# Patient Record
Sex: Male | Born: 1970 | Race: White | Hispanic: No | Marital: Married | Smoking: Former smoker
Health system: Southern US, Community
[De-identification: ages and names within clinical notes are randomized; demographics above are authoritative.]

## PROBLEM LIST (undated history)

## (undated) DIAGNOSIS — T7840XA Allergy, unspecified, initial encounter: Secondary | ICD-10-CM

## (undated) DIAGNOSIS — I10 Essential (primary) hypertension: Secondary | ICD-10-CM

## (undated) DIAGNOSIS — E785 Hyperlipidemia, unspecified: Secondary | ICD-10-CM

## (undated) HISTORY — DX: Hyperlipidemia, unspecified: E78.5

## (undated) HISTORY — PX: URETHRAL DILATION: SUR417

## (undated) HISTORY — DX: Essential (primary) hypertension: I10

## (undated) HISTORY — DX: Allergy, unspecified, initial encounter: T78.40XA

---

## 1999-01-31 HISTORY — PX: COLONOSCOPY: SHX174

## 2016-02-25 ENCOUNTER — Ambulatory Visit: Payer: 59 | Admitting: Family Medicine

## 2016-03-06 ENCOUNTER — Encounter: Payer: Self-pay | Admitting: Family Medicine

## 2016-03-06 ENCOUNTER — Ambulatory Visit (INDEPENDENT_AMBULATORY_CARE_PROVIDER_SITE_OTHER): Payer: 59 | Admitting: Family Medicine

## 2016-03-06 VITALS — BP 130/68 | HR 84 | Temp 98.4°F | Resp 14 | Ht 75.0 in | Wt 231.0 lb

## 2016-03-06 DIAGNOSIS — I10 Essential (primary) hypertension: Secondary | ICD-10-CM

## 2016-03-06 DIAGNOSIS — S46002A Unspecified injury of muscle(s) and tendon(s) of the rotator cuff of left shoulder, initial encounter: Secondary | ICD-10-CM

## 2016-03-06 DIAGNOSIS — E785 Hyperlipidemia, unspecified: Secondary | ICD-10-CM | POA: Diagnosis not present

## 2016-03-06 DIAGNOSIS — Z0000 Encounter for general adult medical examination without abnormal findings: Secondary | ICD-10-CM

## 2016-03-06 DIAGNOSIS — M25362 Other instability, left knee: Secondary | ICD-10-CM | POA: Diagnosis not present

## 2016-03-06 NOTE — Assessment & Plan Note (Signed)
He is on very minimal medication, I honestly do not think it is doing anything for him TIW, so will discontinue and have him monitor his BP

## 2016-03-06 NOTE — Assessment & Plan Note (Signed)
Continue red yeast rice, recheck fasting labs

## 2016-03-06 NOTE — Patient Instructions (Signed)
Return for fasting labs  MRI of shoulder/Knee  Stop Lisinopril You can stop the Biotin F/U pending results

## 2016-03-06 NOTE — Progress Notes (Signed)
Patient ID: Christopher Keller Cino, male   DOB: September 27, 1971, 45 y.o.   MRN: [id number]   Subjective:    Patient ID: Christopher Keller Inskeep, male    DOB: September 27, 1971, 45 y.o.   MRN: [id number]  [facility]  Patient here to establish care. His previous primary care provider was in [place]-Dr. Caceras   His past medical history as well as medications were reviewed. He has history of hypertension noted the past couple years when he gained weight. He is down 20 pounds from his heaviest. He now takes lisinopril 2.5 mg 3 times a week yesterday started tapering off himself. He does not desire to be on any medications for blood pressure. He does have family history of some hypertension and diabetes.  He has history of migraines he tried prescription medications but was concerned about all the side effects therefore he only uses Advil he gets them typically 2-3 times a year.  Left knee discomfort worsened over the past couple months. He denies the particular pain but states that he gets significant popping and feels like he has not stable PSA started running again back in March and try to lose weight and that is when he has noted increased issues with his knee.  Left shoulder pain for the past 6 months. He denies any particular injury but he's actually had to avoid lifting or doing overhead motions because of pain and limited range of motion he states that his shoulder was causing issues before he started exercising again. He is concerned that he may have torn something but [place] evaluated.  He also notices when he runs he gets discomfort in his rectal area directly afterwards. He does have history of hemorrhoids has had an early colonoscopy but is on keeps his bowel soft is not had any hemorrhoidal bleeding. He has not noticed any change in his hemorrhoids with his running regimen. At times he may have soreness right in the gluteal cleft and rectal area  for couple days after he runs. If he does not run he does not have any pain he also does not have any pain with bowel movements.    Review Of Systems:  GEN- denies fatigue, fever, weight loss,weakness, recent illness HEENT- denies eye drainage, change in vision, nasal discharge, CVS- denies chest pain, palpitations RESP- denies SOB, cough, wheeze ABD- denies N/V, change in stools, abd pain GU- denies dysuria, hematuria, dribbling, incontinence MSK- +joint pain, muscle aches, injury Neuro- denies headache, dizziness, syncope, seizure activity       Objective:    BP 130/68 mmHg  Pulse 84  Temp(Src) 98.4 F (36.9 C) (Oral)  Resp 14  Ht 6\' 3"  (1.905 m)  Wt 231 lb (104.781 kg)  BMI 28.87 kg/m2 GEN- NAD, alert and oriented x3 HEENT- PERRL, EOMI, non injected sclera, pink conjunctiva, MMM, oropharynx clear Neck- Supple, no thyromegaly CVS- RRR, no murmur RESP-CTAB ABD-NABS,soft,NT,ND MSK- Spine NT, neg SLR, mild TTP in gluteal cleft near tip of coccyx, FROM Right UE, Decreased ROM LUE, pain with neer's, pain with empty can left side, back scratch limited ROM, biceps in tact, Bilat knee normal inspection, popping sensation left knee with flexion of foot, no effusion,ligaments in tact, patella tracks okay   EXT- No edema Pulses- Radial, DP- 2+        Assessment & Plan:    Discussed his suppplements, he can D/C Biotin, ginko, not necessary at this time  Problem List Items Addressed This Visit    Hyperlipidemia    Continue red yeast rice, recheck fasting labs      Essential hypertension    He is on very minimal medication, I honestly do not think it is doing anything for him TIW, so will discontinue and have him monitor his BP       Other Visit Diagnoses    Routine general medical examination at a health care facility    -  Primary    CPE done, return for fasting labs, immunizations UTD, unclear cuase of the pain in gluteal cleft, no injury, possible  MSK instability  with running ?    Rotator cuff injury, left, initial encounter        Concern for rotator cuff/ or possible deltoid injury, obtain MRI, he has loss of ROM for > 3 months now    Knee instability, left        Obtain xray of left knee evaluate for loose body, degenerative changes       Note: This dictation was prepared with Dragon dictation along with smaller phrase technology. Any transcriptional errors that result from this process are unintentional.

## 2016-03-17 ENCOUNTER — Other Ambulatory Visit: Payer: 59

## 2016-03-17 DIAGNOSIS — Z0000 Encounter for general adult medical examination without abnormal findings: Secondary | ICD-10-CM

## 2016-03-17 LAB — COMPREHENSIVE METABOLIC PANEL
ALT: 13 U/L (ref 9–46)
AST: 16 U/L (ref 10–40)
Albumin: 4.3 g/dL (ref 3.6–5.1)
Alkaline Phosphatase: 56 U/L (ref 40–115)
BUN: 15 mg/dL (ref 7–25)
CALCIUM: 9.6 mg/dL (ref 8.6–10.3)
CO2: 26 mmol/L (ref 20–31)
CREATININE: 1.02 mg/dL (ref 0.60–1.35)
Chloride: 102 mmol/L (ref 98–110)
GLUCOSE: 105 mg/dL — AB (ref 70–99)
Potassium: 4.7 mmol/L (ref 3.5–5.3)
SODIUM: 137 mmol/L (ref 135–146)
Total Bilirubin: 0.5 mg/dL (ref 0.2–1.2)
Total Protein: 6.8 g/dL (ref 6.1–8.1)

## 2016-03-17 LAB — CBC WITH DIFFERENTIAL/PLATELET
BASOS ABS: 56 {cells}/uL (ref 0–200)
Basophils Relative: 1 %
Eosinophils Absolute: 560 cells/uL — ABNORMAL HIGH (ref 15–500)
Eosinophils Relative: 10 %
HEMATOCRIT: 41.3 % (ref 38.5–50.0)
HEMOGLOBIN: 13.7 g/dL (ref 13.0–17.0)
LYMPHS ABS: 1792 {cells}/uL (ref 850–3900)
LYMPHS PCT: 32 %
MCH: 29.5 pg (ref 27.0–33.0)
MCHC: 33.2 g/dL (ref 32.0–36.0)
MCV: 89 fL (ref 80.0–100.0)
MONO ABS: 392 {cells}/uL (ref 200–950)
MPV: 9.2 fL (ref 7.5–12.5)
Monocytes Relative: 7 %
NEUTROS PCT: 50 %
Neutro Abs: 2800 cells/uL (ref 1500–7800)
Platelets: 291 10*3/uL (ref 140–400)
RBC: 4.64 MIL/uL (ref 4.20–5.80)
RDW: 13.8 % (ref 11.0–15.0)
WBC: 5.6 10*3/uL (ref 3.8–10.8)

## 2016-03-17 LAB — LIPID PANEL
Cholesterol: 195 mg/dL (ref 125–200)
HDL: 51 mg/dL (ref >=40)
LDL Cholesterol: 116 mg/dL (ref <130)
Total CHOL/HDL Ratio: 3.8 Ratio (ref <=5.0)
Triglycerides: 138 mg/dL (ref <150)
VLDL: 28 mg/dL (ref <30)

## 2016-03-18 LAB — PSA: PSA: 0.64 ng/mL (ref <=4.00)

## 2016-04-08 ENCOUNTER — Telehealth: Payer: Self-pay | Admitting: Family Medicine

## 2016-04-08 NOTE — Telephone Encounter (Signed)
-----   Message from Donne AnonKim M Plummer, LPN sent at 4/[id number]/09816/14/2017  3:00 PM EDT ----- Regarding: RE: MRI Did call pt and he has been busy out of state with his job.  Says yes he has been procrastinating about calling them back. ----- Message -----    From: Salley ScarletKawanta F Hebron, MD    Sent: 04/08/2016   2:04 PM      To: Donne AnonKim M Plummer, LPN Subject: MRI                                              When is pt MRI scheduled, this  Is one I did peer to peer a about 2 weeks ago, I dont see appt time for the MRI

## 2016-04-08 NOTE — Telephone Encounter (Signed)
noted 

## 2016-04-24 ENCOUNTER — Ambulatory Visit
Admission: RE | Admit: 2016-04-24 | Discharge: 2016-04-24 | Disposition: A | Discharge status: Home / Self Care | Payer: 59 | Source: Ambulatory Visit | Attending: Family Medicine | Admitting: Family Medicine

## 2016-04-24 ENCOUNTER — Other Ambulatory Visit: Payer: Self-pay | Admitting: *Deleted

## 2016-04-24 DIAGNOSIS — M25362 Other instability, left knee: Secondary | ICD-10-CM

## 2016-04-24 DIAGNOSIS — M75100 Unspecified rotator cuff tear or rupture of unspecified shoulder, not specified as traumatic: Secondary | ICD-10-CM

## 2016-04-24 DIAGNOSIS — S46002A Unspecified injury of muscle(s) and tendon(s) of the rotator cuff of left shoulder, initial encounter: Secondary | ICD-10-CM

## 2016-04-24 MED ORDER — MELOXICAM 7.5 MG PO TABS: 7.5000 mg | ORAL_TABLET | Freq: Every day | ORAL | Status: DC

## 2016-08-13 ENCOUNTER — Ambulatory Visit: Payer: 59 | Admitting: *Deleted

## 2016-08-13 DIAGNOSIS — Z0000 Encounter for general adult medical examination without abnormal findings: Secondary | ICD-10-CM

## 2016-08-13 NOTE — Progress Notes (Signed)
Patient ID: Christopher FramesBryan Keller, male   DOB: November 02, 1970, 45 y.o.   MRN: [id number]   [facility].   Patient has been seen for CPE on 03/17/2016.  [facility]

## 2016-10-29 ENCOUNTER — Emergency Department (HOSPITAL_COMMUNITY): Payer: 59

## 2016-10-29 ENCOUNTER — Encounter (HOSPITAL_COMMUNITY): Payer: Self-pay

## 2016-10-29 ENCOUNTER — Emergency Department (HOSPITAL_COMMUNITY)
Admission: EM | Admit: 2016-10-29 | Discharge: 2016-10-29 | Disposition: A | Discharge status: Home / Self Care | Payer: 59 | Attending: Emergency Medicine | Admitting: Emergency Medicine

## 2016-10-29 DIAGNOSIS — Z87891 Personal history of nicotine dependence: Secondary | ICD-10-CM | POA: Insufficient documentation

## 2016-10-29 DIAGNOSIS — Z79899 Other long term (current) drug therapy: Secondary | ICD-10-CM | POA: Insufficient documentation

## 2016-10-29 DIAGNOSIS — I10 Essential (primary) hypertension: Secondary | ICD-10-CM | POA: Insufficient documentation

## 2016-10-29 DIAGNOSIS — R55 Syncope and collapse: Secondary | ICD-10-CM | POA: Insufficient documentation

## 2016-10-29 DIAGNOSIS — R42 Dizziness and giddiness: Secondary | ICD-10-CM | POA: Diagnosis present

## 2016-10-29 DIAGNOSIS — R030 Elevated blood-pressure reading, without diagnosis of hypertension: Secondary | ICD-10-CM

## 2016-10-29 LAB — CBC WITH DIFFERENTIAL/PLATELET
BASOS PCT: 1 %
Basophils Absolute: 0 10*3/uL (ref 0.0–0.1)
EOS ABS: 0.3 10*3/uL (ref 0.0–0.7)
Eosinophils Relative: 4 %
HCT: 43.4 % (ref 39.0–52.0)
HEMOGLOBIN: 14.4 g/dL (ref 13.0–17.0)
LYMPHS ABS: 2.1 10*3/uL (ref 0.7–4.0)
Lymphocytes Relative: 33 %
MCH: 30.3 pg (ref 26.0–34.0)
MCHC: 33.2 g/dL (ref 30.0–36.0)
MCV: 91.2 fL (ref 78.0–100.0)
Monocytes Absolute: 0.4 10*3/uL (ref 0.1–1.0)
Monocytes Relative: 6 %
NEUTROS ABS: 3.5 10*3/uL (ref 1.7–7.7)
NEUTROS PCT: 56 %
Platelets: 242 10*3/uL (ref 150–400)
RBC: 4.76 MIL/uL (ref 4.22–5.81)
RDW: 13 % (ref 11.5–15.5)
WBC: 6.3 10*3/uL (ref 4.0–10.5)

## 2016-10-29 LAB — BASIC METABOLIC PANEL
Anion gap: 9 (ref 5–15)
BUN: 14 mg/dL (ref 6–20)
CHLORIDE: 102 mmol/L (ref 101–111)
CO2: 27 mmol/L (ref 22–32)
Calcium: 9.4 mg/dL (ref 8.9–10.3)
Creatinine, Ser: 1.13 mg/dL (ref 0.61–1.24)
GFR calc non Af Amer: >60 mL/min (ref >60)
Glucose, Bld: 103 mg/dL — ABNORMAL HIGH (ref 65–99)
POTASSIUM: 4.1 mmol/L (ref 3.5–5.1)
SODIUM: 138 mmol/L (ref 135–145)

## 2016-10-29 MED ORDER — LISINOPRIL 10 MG PO TABS
10.0000 mg | ORAL_TABLET | Freq: Once | ORAL | Status: AC
  Administered 2016-10-29: 10 mg via ORAL
  Filled 2016-10-29: qty 1

## 2016-10-29 MED ORDER — SODIUM CHLORIDE 0.9 % IV BOLUS (SEPSIS)
1000.0000 mL | Freq: Once | INTRAVENOUS | Status: AC
  Administered 2016-10-29: 1000 mL via INTRAVENOUS

## 2016-10-29 MED ORDER — LISINOPRIL 10 MG PO TABS: 10.0000 mg | ORAL_TABLET | Freq: Every day | ORAL | 0 refills | Status: DC

## 2016-10-29 NOTE — ED Notes (Addendum)
Pt has had a syncopal episode on new years eve-- unusual for patient.  Was in a meeting today, standing up, felt same way-- left meeting and went to nurses office and felt like he could not talk- has hx of high blood pressure- untreated.  Episode today started at 11:30am

## 2016-10-29 NOTE — ED Notes (Signed)
Pt resting comfortably, family at bedside.  Pt ambulated to the Br without difficulty, denies any dizziness.

## 2016-10-29 NOTE — Discharge Instructions (Signed)
It is very important he follow-up with your primary care provider next week for a blood pressure recheck and discussion of today's diagnosis. Please return to the ER for returning dizziness, slurred speech, new or worsening symptoms, any additional concerns.

## 2016-10-29 NOTE — ED Triage Notes (Signed)
CBG 123 pt had an episode where he felt dizzy and than had a headache with some light headedness and had some difficulty speaking pt currently has no deficits

## 2016-10-29 NOTE — ED Notes (Signed)
Pt returned to room from imaging department.  

## 2016-10-29 NOTE — ED Provider Notes (Signed)
[facility]EMER[facility]ENCY DEPT Provider Note   CSN: [id number] Arrival date & time: 10/29/16  1238     History   Chief Complaint Chief Complaint  Patient presents with  . Dizziness    pt was at work when he suddenly felt dizzy and had some difficulty speaking at 11:30 am this morning symptoms are now resolved     HPI Christopher Keller is a 46 y.o. male.  The history is provided by the patient and medical records. No language interpreter was used.     Christopher Keller is a 46 y.o. male  who presents to the Emergency Department for evaluation following near syncopal episode at 11:30 this morning. Patient states that he was at work, standing up during a meeting in a cool room when he suddenly felt flushed. He then began feeling dizzy which was described as his vision tunneling and feeling as if he was going to pass out. He went out of the meeting and to the office nurse. He then sat down on, never having a complete syncopal episode or loss of consciousness. He did not fall or hit his head. He states during the episode he had difficulty speaking (trying to get words out) but no slurred speech. Sensation states that he has had 5-6 similar episodes, however all of these have occurred right before urination. He has been seen and worked up by a primary care provider for this in the past and told that sometimes this may happen but no true etiology known. Denies muscle weakness. No recent illness or fevers. He felt somewhat improved after sitting in upon arrival to ED patient states symptoms are nearly resolved. He states that his head still feels "groggy" and a little lightheaded, but otherwise back to his baseline. He did receive 2 baby aspirin at work prior to his arrival today, but no other medications taken. Patient does have a history of "borderline" hypertension which is has been trying to improve with diet and exercise. He has never been placed on medication.    Past Medical History:  Diagnosis Date  . Allergy    seasonal  . Hyperlipidemia   . Hypertension     Patient Active Problem List   Diagnosis Date Noted  . Hyperlipidemia 03/06/2016  . Essential hypertension 03/06/2016    Past Surgical History:  Procedure Laterality Date  . COLONOSCOPY  01/31/1999  . URETHRAL DILATION         Home Medications    Prior to Admission medications   Medication Sig Start Date End Date Taking? Authorizing Provider  Cholecalciferol (VITAMIN D) 2000 units CAPS Take 1 capsule by mouth daily.    Yes Historical Provider, MD  CINNAMON PO Take 1 tablet by mouth daily.   Yes Historical Provider, MD  Coenzyme Q10 (CO Q 10) 10 MG CAPS Take 1 tablet by mouth daily.    Yes Historical Provider, MD  dextromethorphan-guaiFENesin (MUCINEX DM) 30-600 MG 12hr tablet Take 1 tablet by mouth 2 (two) times daily as needed for cough.   Yes Historical Provider, MD  Ginkgo Biloba 40 MG TABS Take 1 tablet by mouth daily.    Yes Historical Provider, MD  Glucosamine-Chondroit-Vit C-Mn (GLUCOSAMINE 1500 COMPLEX PO) Take 1 tablet by mouth daily.    Yes Historical Provider, MD  OVER THE COUNTER MEDICATION Take 1 tablet by mouth daily. Prostate Plus    Yes Historical Provider, MD  Red Yeast Rice Extract (RED YEAST RICE PO) Take 1 tablet by mouth daily.    Yes Historical Provider,  MD  vitamin C (ASCORBIC ACID) 500 MG tablet Take 1,000 mg by mouth daily.   Yes Historical Provider, MD  lisinopril (PRINIVIL,ZESTRIL) 10 MG tablet Take 1 tablet (10 mg total) by mouth daily. 10/29/16   Chase Picket Shandria Clinch, PA-C  meloxicam (MOBIC) 7.5 MG tablet Take 1 tablet (7.5 mg total) by mouth daily. Patient not taking: Reported on 10/29/2016 04/24/16   Salley Scarlet, MD    Family History Family History  Problem Relation Age of Onset  . Alcohol abuse Father   . Hypertension Father   . Hyperlipidemia Father   . Hypertension Mother   . Hyperlipidemia Mother   . Diabetes Maternal Grandmother     Social History Social History  Substance Use Topics    . Smoking status: Former Games developer  . Smokeless tobacco: Former Neurosurgeon  . Alcohol use 0.0 oz/week     Comment: occasionally      Allergies   Patient has no known allergies.   Review of Systems Review of Systems  Constitutional: Negative for chills and fever.  HENT: Negative for trouble swallowing.   Eyes: Negative for photophobia.  Respiratory: Negative for cough and shortness of breath.   Cardiovascular: Negative.   Gastrointestinal: Negative for abdominal pain, nausea and vomiting.  Genitourinary: Negative for dysuria.  Musculoskeletal: Negative for back pain and neck pain.  Skin: Negative for rash.  Neurological: Positive for dizziness, weakness and headaches.     Physical Exam Updated Vital Signs BP (!) 155/102   Pulse 79   Temp 98 F (36.7 C)   Resp 14   Ht 6\' 3"  (1.905 m)   Wt 106.1 kg   SpO2 100%   BMI 29.25 kg/m   Physical Exam  Constitutional: He is oriented to person, place, and time. He appears well-developed and well-nourished. No distress.  HENT:  Head: Normocephalic and atraumatic.  Eyes: EOM are normal. Pupils are equal, round, and reactive to light.  No nystagmus.  Cardiovascular: Normal rate, regular rhythm and normal heart sounds.   No murmur heard. Pulmonary/Chest: Effort normal and breath sounds normal. No respiratory distress. He has no wheezes. He has no rales.  Abdominal: Soft. He exhibits no distension. There is no tenderness.  Musculoskeletal: Normal range of motion.  Neurological: He is alert and oriented to person, place, and time.  Alert, oriented, thought content appropriate, able to give a coherent history. Speech is clear and goal oriented, able to follow commands.  Cranial Nerves:  II:  Peripheral visual fields grossly normal, pupils equal, round, reactive to light III, IV, VI: EOM intact bilaterally, ptosis not present V,VII: smile symmetric, eyes kept closed tightly against resistance, facial light touch sensation equal VIII:  hearing grossly normal IX, X: symmetric soft palate movement, uvula elevates symmetrically  XI: bilateral shoulder shrug symmetric and strong XII: midline tongue extension 5/5 muscle strength in upper and lower extremities bilaterally including strong and equal grip strength and dorsiflexion/plantar flexion Sensory to light touch normal in all four extremities.  Normal finger-to-nose and rapid alternating movements. Normal gait and balance. Negative romberg, no pronator drift.  Skin: Skin is warm and dry.  Nursing note and vitals reviewed.    ED Treatments / Results  Labs (all labs ordered are listed, but only abnormal results are displayed) Labs Reviewed  BASIC METABOLIC PANEL - Abnormal; Notable for the following:       Result Value   Glucose, Bld 103 (*)    All other components within normal limits  CBC WITH DIFFERENTIAL/PLATELET  EKG  EKG Interpretation None       Radiology Ct Head Wo Contrast  Result Date: 10/29/2016 CLINICAL DATA:  Syncopal episode.  Dizziness. EXAM: CT HEAD WITHOUT CONTRAST TECHNIQUE: Contiguous axial images were obtained from the base of the skull through the vertex without intravenous contrast. COMPARISON:  None. FINDINGS: Brain: The ventricles are normal in size and configuration. No extra-axial fluid collections are identified. The gray-white differentiation is normal. No CT findings for acute intracranial process such as hemorrhage or infarction. No mass lesions. The brainstem and cerebellum are grossly normal. Vascular: No hyperdense vessel or unexpected calcification. Skull: No skull lesions or fracture. Sinuses/Orbits: Moderate mucoperiosteal thickening involving both maxillary sinuses. The left maxillary sinus is hypoplastic. There is also scattered ethmoid sinus disease. The frontal sinuses are clear and the mastoid air cells and middle ear cavities are clear. The globes are intact. Other: No scalp lesions or hematoma. IMPRESSION: No acute  intracranial findings or mass lesions. Scattered paranasal sinus disease. Electronically Signed   By: Rudie MeyerP.  Gallerani M.D.   On: 10/29/2016 14:46    Procedures Procedures (including critical care time)  Medications Ordered in ED Medications  sodium chloride 0.9 % bolus 1,000 mL (0 mLs Intravenous Stopped 10/29/16 1627)  lisinopril (PRINIVIL,ZESTRIL) tablet 10 mg (10 mg Oral Given 10/29/16 1451)     Initial Impression / Assessment and Plan / ED Course  I have reviewed the triage vital signs and the nursing notes.  Pertinent labs & imaging results that were available during my care of the patient were reviewed by me and considered in my medical decision making (see chart for details).  Clinical Course    Christopher FramesBryan Keller is a 46 y.o. male who presents to ED for evaluation following episode of dizziness and near syncopal episode just prior to arrival. Patient had prodromal symptoms of flushing and tunnel vision as well as feeling lightheaded. He then found the nurse and sat down. No LOC or head injury. Coworker at bedside who witnessed event and states no slurred speech or muscle weakness. No focal neuro deficits on exam. Speech is clear with no evidence of dysarthria/aphasia. 5/5 muscle strength in all extremities. Initial blood pressure upon arrival of 163/1:15. Patient initially stated that he had not been on blood pressure medication in the past. Upon arrival to the wife, it was noted that he has been on lisinopril in the past and was weaned off. Will restart lisinopril today. Patient has ambulated around the ED with a steady gait and without difficulty. He has no complaints on reevaluation and all symptoms have resolved. Patient has a primary care provider and states that he can follow up closely in the next week. I stressed the importance of this follow-up. Reasons to return to the ER were discussed. All questions answered.  Patient discussed with Dr. Clayborne DanaMesner who agrees with treatment plan.   Final  Clinical Impressions(s) / ED Diagnoses   Final diagnoses:  Near syncope  Elevated blood pressure reading    New Prescriptions New Prescriptions   LISINOPRIL (PRINIVIL,ZESTRIL) 10 MG TABLET    Take 1 tablet (10 mg total) by mouth daily.     [facility] LLCJaime Pilcher Kayli Beal, PA-C 10/29/16 1637    Marily MemosJason Mesner, MD 10/30/16 [phone]

## 2016-10-29 NOTE — ED Notes (Signed)
Did receive ASA x2 at work prior to coming.

## 2016-11-04 ENCOUNTER — Encounter: Payer: Self-pay | Admitting: Family Medicine

## 2016-11-04 ENCOUNTER — Ambulatory Visit (INDEPENDENT_AMBULATORY_CARE_PROVIDER_SITE_OTHER): Payer: 59 | Admitting: Family Medicine

## 2016-11-04 VITALS — BP 148/82 | HR 82 | Temp 98.4°F | Resp 14 | Ht 75.0 in | Wt 234.0 lb

## 2016-11-04 DIAGNOSIS — G4700 Insomnia, unspecified: Secondary | ICD-10-CM | POA: Diagnosis not present

## 2016-11-04 DIAGNOSIS — I10 Essential (primary) hypertension: Secondary | ICD-10-CM | POA: Diagnosis not present

## 2016-11-04 DIAGNOSIS — R55 Syncope and collapse: Secondary | ICD-10-CM | POA: Diagnosis not present

## 2016-11-04 MED ORDER — TRAZODONE HCL 50 MG PO TABS: 25.0000 mg | ORAL_TABLET | Freq: Every evening | ORAL | 1 refills | Status: DC | PRN

## 2016-11-04 MED ORDER — LISINOPRIL 20 MG PO TABS: 20.0000 mg | ORAL_TABLET | Freq: Every day | ORAL | 3 refills | Status: DC

## 2016-11-04 NOTE — Patient Instructions (Signed)
Lisinopril 20mg   trazadone as needed for sleep  Cardiology referral  F/U after seen by cardiology

## 2016-11-04 NOTE — Progress Notes (Signed)
Subjective:    Patient ID: Christopher FramesBryan Creason, male    DOB: 1971-04-12, 46 y.o.   MRN: [id number]  Patient presents for ER F/U (HTN) Here to follow-up hypertension he had a near syncopal event he was seen in the emergency room on December 4. His blood pressure was noted to be elevated at that time. He started tapering himself off of his blood pressure medication which she told me when he established care back in May that time he was on a 2.5 mg of lisinopril 3 times a week .He had not been checking his blood pressure when he finally came off of it. He states these have random episodes actually the past years where he would just pass out this last time and [place] he was going to the restroom and found himself on the floor he states he was told more of a vasovagal syncope. But then when he was at work last week he started feeling dizzy his balance was off he started stumbling around his words were slurring he was getting flushed. They check his blood pressure was significantly high he believes 180/100 or so they checked his blood sugar this was normal at 110 he did not have any true chest pain. EMS was called he was sent to the emergency room CT scan of head was done which was unremarkable his labs were unremarkable his blood pressure was still elevated in the 150s he was given lisinopril 10 mg. And sent home. His speech and function was back to normal at discharge from the emergency room. Since then he has been monitoring his blood pressure it was still upper 140s over 80s to 90s therefore he started taking 20 mg of lisinopril. The past 2 days his morning blood pressures have been 120s over 80s however when he checks it at work which she knows a highly stressful environment for him if shoots up to the 140s. He has not had any further headache dizzy spells chest pain shortness of breath episodes these past few days. He is concerned it might be something else underlying C does have family  history of heart disease.  He is also under significant stress at work and states that it everything is always on a 10. He is not sleeping very well a few times a week he will end up taking Benadryl to help him sleep if not he will get up and work on his computer. He noted that he has poor sleep hygiene. He does still try to exercise on a regular basis is never had any difficulty while running    Review Of Systems:  GEN- denies fatigue, fever, weight loss,weakness, recent illness HEENT- denies eye drainage, change in vision, nasal discharge, CVS- denies chest pain, palpitations RESP- denies SOB, cough, wheeze ABD- denies N/V, change in stools, abd pain GU- denies dysuria, hematuria, dribbling, incontinence MSK- denies joint pain, muscle aches, injury Neuro- denies headache, +dizziness, syncope, seizure activity       Objective:    BP (!) 148/82 (BP Location: Right Arm, Patient Position: Sitting, Cuff Size: Large)   Pulse 82   Temp 98.4 F (36.9 C) (Oral)   Resp 14   Ht 6\' 3"  (1.905 m)   Wt 234 lb (106.1 kg)   SpO2 97%   BMI 29.25 kg/m  GEN- NAD, alert and oriented x3 HEENT- PERRL, EOMI, non injected sclera, pink conjunctiva, MMM, oropharynx clear Neck- Supple, no thyromegaly CVS- RRR, no murmur RESP-CTAB Neurp-CNII-XII in tact  no deficits  EXT- No edema Pulses- Radial, DP- 2+  EKG- NSR      Assessment & Plan:      Problem List Items Addressed This Visit    Essential hypertension - Primary    uncontrolled, but strong family history of heart disease and hypertension. CMET normal, CBC normal, normal TSH in past. Will have him take lisinopril 20 mg daily for now. I gently think that the highly stressful environment he is in his contributing to this elevated blood pressure but some of the symptoms he is having. It sounds like he may have had a vasovagal syncopal event on New Year's Eve when he was urinating but he is also having other episodes of passing out which could be  cardiac. I think he would benefit from cardiology evaluation and stress testing.  Discussed his stress and his sleep. The poor sleep is also contributed. Discussed to decrease his caffeine as he often drinks coffee, keep him going through the day. We discussed melatonin and he was to try more natural things he was to try to avoid prescription medications even higher doses for his blood pressure necessary. I did give him prescription for trazodone that he can use half a tablet as needed for sleep instead of taking all of the over-the-counter sleep aids.      Relevant Medications   lisinopril (PRINIVIL,ZESTRIL) 20 MG tablet   Other Relevant Orders   EKG 12-Lead (Completed)    Other Visit Diagnoses    Vasovagal syncope       Relevant Medications   lisinopril (PRINIVIL,ZESTRIL) 20 MG tablet   Insomnia, unspecified type          Note: This dictation was prepared with Dragon dictation along with smaller phrase technology. Any transcriptional errors that result from this process are unintentional.

## 2016-11-05 NOTE — Assessment & Plan Note (Signed)
uncontrolled, but strong family history of heart disease and hypertension. CMET normal, CBC normal, normal TSH in past. Will have him take lisinopril 20 mg daily for now. I gently think that the highly stressful environment he is in his contributing to this elevated blood pressure but some of the symptoms he is having. It sounds like he may have had a vasovagal syncopal event on New Year's Eve when he was urinating but he is also having other episodes of passing out which could be cardiac. I think he would benefit from cardiology evaluation and stress testing.  Discussed his stress and his sleep. The poor sleep is also contributed. Discussed to decrease his caffeine as he often drinks coffee, keep him going through the day. We discussed melatonin and he was to try more natural things he was to try to avoid prescription medications even higher doses for his blood pressure necessary. I did give him prescription for trazodone that he can use half a tablet as needed for sleep instead of taking all of the over-the-counter sleep aids.

## 2016-11-10 ENCOUNTER — Ambulatory Visit: Payer: 59 | Admitting: Student

## 2016-12-01 ENCOUNTER — Encounter: Payer: Self-pay | Admitting: Internal Medicine

## 2016-12-01 ENCOUNTER — Ambulatory Visit (INDEPENDENT_AMBULATORY_CARE_PROVIDER_SITE_OTHER): Payer: 59 | Admitting: Internal Medicine

## 2016-12-01 VITALS — BP 128/92 | HR 72 | Ht 75.0 in | Wt 231.0 lb

## 2016-12-01 DIAGNOSIS — R55 Syncope and collapse: Secondary | ICD-10-CM | POA: Insufficient documentation

## 2016-12-01 DIAGNOSIS — Z8249 Family history of ischemic heart disease and other diseases of the circulatory system: Secondary | ICD-10-CM | POA: Diagnosis not present

## 2016-12-01 NOTE — Patient Instructions (Addendum)
  Your physician has requested that you have an exercise tolerance test. For further information please visit [email or web address]. Please also follow instruction sheet, as given.  Dr. Rennis GoldenHilty has ordered a coronary calcium score - [address] done [address]  Your physician recommends that you schedule a follow-up appointment after your testing.

## 2016-12-01 NOTE — Progress Notes (Signed)
OFFICE NOTE  Chief Complaint:  Syncope  Primary Care Physician: Milinda Antis, MD  HPI:  Christopher Keller is a 46 y.o. 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 He has a past medical history significant for essentially annual syncopal events. In the past has been told this is vasovagal syncope. He reports multiple episodes that occur mostly at night after he has had a few drinks when he gets up to urinate he feels dizzy and may pass out. This is been a problem since about age 82. The episodes of occurred with exercise or are associated with chest pain or worsening shortness of breath. He was physically active and recently has been under significant stress in his new job. He reports difficulty sleeping at night. He had an episode around [place] which was very similar to his other episodes. He's also had some elevated blood pressures. Primary care provider started him on lisinopril and he's had some improvement in blood pressures. I reviewed a home log that he has capped which appears to show good blood pressure control. He reports his last LDL was 105. There is a strong family history of coronary disease with his father who just had 3 vessel bypass and heart disease in his uncles. He is concerned about his risk for coronary artery disease.  PMHx:  Past Medical History:  Diagnosis Date  . Allergy    seasonal  . Hyperlipidemia   . Hypertension     Past Surgical History:  Procedure Laterality Date  . COLONOSCOPY  01/31/1999  . URETHRAL DILATION      FAMHx:  Family History  Problem Relation Age of Onset  . Alcohol abuse Father   . Hypertension Father   . Hyperlipidemia Father   . Hypertension Mother   . Hyperlipidemia Mother   . Diabetes Maternal Grandmother     SOCHx:   reports that he has quit smoking. He has quit using smokeless tobacco. He reports that he drinks alcohol. He reports that he does not use  drugs.  ALLERGIES:  No Known Allergies  ROS: Pertinent items noted in HPI and remainder of comprehensive ROS otherwise negative.  HOME MEDS: Current Outpatient Prescriptions on File Prior to Visit  Medication Sig Dispense Refill  . Cholecalciferol (VITAMIN D) 2000 units CAPS Take 1 capsule by mouth daily.     Marland Kitchen CINNAMON PO Take 1 tablet by mouth daily.    . Coenzyme Q10 (CO Q 10) 10 MG CAPS Take 1 tablet by mouth daily.     Marland Kitchen dextromethorphan-guaiFENesin (MUCINEX DM) 30-600 MG 12hr tablet Take 1 tablet by mouth daily as needed for cough.     . Ginkgo Biloba 40 MG TABS Take 1 tablet by mouth daily.     . Glucosamine-Chondroit-Vit C-Mn (GLUCOSAMINE 1500 COMPLEX PO) Take 1 tablet by mouth daily.     Marland Kitchen lisinopril (PRINIVIL,ZESTRIL) 20 MG tablet Take 1 tablet (20 mg total) by mouth daily. 30 tablet 3  . OVER THE COUNTER MEDICATION Take 1 tablet by mouth daily. Prostate Plus     . Red Yeast Rice Extract (RED YEAST RICE PO) Take 1 tablet by mouth daily.     . traZODone (DESYREL) 50 MG tablet Take 0.5-1 tablets (25-50 mg total) by mouth at bedtime as needed for sleep. 30 tablet 1  . vitamin C (ASCORBIC ACID) 500 MG tablet Take 1,000 mg by mouth daily.     No current  facility-administered medications on file prior to visit.     LABS/IMAGING: No results found for this or any previous visit (from the past 48 hour(s)). No results found.  WEIGHTS: Wt Readings from Last 3 Encounters:  12/01/16 231 lb (104.8 kg)  11/04/16 234 lb (106.1 kg)  10/29/16 234 lb (106.1 kg)    VITALS: BP (!) 128/92   Pulse 72   Ht 6\' 3"  (1.905 m)   Wt 231 lb (104.8 kg)   BMI 28.87 kg/m   EXAM: General appearance: alert and no distress Neck: no carotid bruit and no JVD Lungs: clear to auscultation bilaterally Heart: regular rate and rhythm, S1, S2 normal, no murmur, click, rub or gallop Abdomen: soft, non-tender; bowel sounds normal; no masses,  no organomegaly Extremities: extremities normal, atraumatic,  no cyanosis or edema Pulses: 2+ and symmetric Skin: Skin color, texture, turgor normal. No rashes or lesions Neurologic: Grossly normal Psych: Pleasant  EKG: Normal sinus rhythm at 73  ASSESSMENT: 1. Vasovagal syncope 2. Family history of premature coronary disease 3. Essential hypertension-controlled  PLAN: 1.   Christopher Keller has had almost yearly episodes of syncope which do sound like vasovagal syncope-particularly commonly he has micturition syncope. There is a strong family history of coronary disease, however and he is interested in primary prevention. He had a lipid profile in May 2017 which showed an LDL of 116 total cholesterol of 195. He has not been on treatment for this. Recently blood pressure was elevated and it does seem to be improved on lisinopril. I would recommend an exercise treadmill stress test as a baseline as he is interested in more physical activity although he really appears in fairly good shape. In addition, will obtain a coronary artery calcium score to help further risk stratify him. If he has significant premature coronary artery disease based on this study, he may need to start on statin therapy.  Thanks for the kind referral. I plan to see him back in a few weeks for follow-up.  Chrystie NoseKenneth C. Dominika Losey, MD, [facility] Attending Cardiologist [facility]  Chrystie NoseKenneth C Zailynn Brandel 12/01/2016, 9:48 AM

## 2016-12-09 ENCOUNTER — Telehealth (HOSPITAL_COMMUNITY): Payer: Self-pay

## 2016-12-09 NOTE — Telephone Encounter (Signed)
Close encounter 

## 2016-12-11 ENCOUNTER — Ambulatory Visit (HOSPITAL_COMMUNITY)
Admission: RE | Admit: 2016-12-11 | Discharge: 2016-12-11 | Disposition: A | Discharge status: Home / Self Care | Payer: 59 | Source: Ambulatory Visit | Attending: Cardiology | Admitting: Cardiology

## 2016-12-11 ENCOUNTER — Ambulatory Visit (INDEPENDENT_AMBULATORY_CARE_PROVIDER_SITE_OTHER)
Admission: RE | Admit: 2016-12-11 | Discharge: 2016-12-11 | Disposition: A | Discharge status: Home / Self Care | Payer: Self-pay | Source: Ambulatory Visit | Attending: Internal Medicine | Admitting: Internal Medicine

## 2016-12-11 DIAGNOSIS — Z8249 Family history of ischemic heart disease and other diseases of the circulatory system: Secondary | ICD-10-CM | POA: Insufficient documentation

## 2016-12-11 DIAGNOSIS — R55 Syncope and collapse: Secondary | ICD-10-CM | POA: Diagnosis not present

## 2016-12-11 LAB — EXERCISE TOLERANCE TEST
CHL CUP MPHR: 173 {beats}/min
CHL CUP RESTING HR STRESS: 68 {beats}/min
Estimated workload: 13.9 METS
Exercise duration (min): 12 min
Exercise duration (sec): 17 s
Peak HR: 171 {beats}/min
Percent HR: 98 %
RPE: 17

## 2016-12-25 ENCOUNTER — Encounter: Payer: Self-pay | Admitting: Internal Medicine

## 2016-12-25 ENCOUNTER — Ambulatory Visit: Payer: 59 | Admitting: Internal Medicine

## 2016-12-25 ENCOUNTER — Ambulatory Visit (INDEPENDENT_AMBULATORY_CARE_PROVIDER_SITE_OTHER): Payer: 59 | Admitting: Internal Medicine

## 2016-12-25 VITALS — BP 118/82 | HR 64 | Ht 75.0 in | Wt 228.8 lb

## 2016-12-25 DIAGNOSIS — E782 Mixed hyperlipidemia: Secondary | ICD-10-CM | POA: Diagnosis not present

## 2016-12-25 DIAGNOSIS — R55 Syncope and collapse: Secondary | ICD-10-CM | POA: Diagnosis not present

## 2016-12-25 DIAGNOSIS — Z8249 Family history of ischemic heart disease and other diseases of the circulatory system: Secondary | ICD-10-CM

## 2016-12-25 NOTE — Patient Instructions (Signed)
Dr Hilty recommends that you schedule a follow-up appointment in 6 months. You will receive a reminder letter in the mail two months in advance. If you don't receive a letter, please call our office to schedule the follow-up appointment.  If you need a refill on your cardiac medications before your next appointment, please call your pharmacy. 

## 2016-12-25 NOTE — Progress Notes (Signed)
OFFICE NOTE  Chief Complaint:  Follow-up studies  Primary Care Physician: Milinda AntisURHAM, KAWANTA, MD  HPI:  Christopher Keller is a 46 y.o. [place]where [place] went to [place] school[place] who was transferred here to work at [employer]. He has a past medical history significant for essentially annual syncopal events. In the past has been told this is vasovagal syncope. He reports multiple episodes that occur mostly at night after he has had a few drinks when he gets up to urinate he feels dizzy and may pass out. This is been a problem since about age 89. The episodes of occurred with exercise or are associated with chest pain or worsening shortness of breath. He was physically active and recently has been under significant stress in his new job. He reports difficulty sleeping at night. He had [place] episode [place] which was very similar to his other episodes. He's also had some elevated blood pressures. Primary care provider started him on lisinopril and he's had some improvement in blood pressures. I reviewed a home log that he has capped which appears to show good blood pressure control. He reports his last LDL was 105. There is a strong family history of coronary disease with his father who just had 3 vessel bypass and heart disease in his uncles. He is concerned about his risk for coronary artery disease.  12/25/2016  Mr. Vilma PraderHeath returns for follow-up. He has not had any more syncopal episodes since I last saw him which we determine his episodes are most likely vasovagal. He has increased his exercise and try to decrease stress at work. He denies any chest pain or worsening shortness of breath. He did not undergo coronary artery calcium scoring with a score of 19 indicating he does have some premature coronary disease. In review his last lipid profile demonstrated total cholesterol 195, HDL-C 51, LDL-C1 16 and triglycerides 138. He does take red yeast rice but has not been on a statin. I  performed a calculation of his coronary risk today with a [place] cardiac risk calculator. His traditional cardiac risk factors would produce a 10 year risk of 5.6% for cardiovascular events and adding he is coronary calcium score actually predicts higher 6.2% 10 year risk. Current guidelines recommend statin therapy in patients with greater than 7.5% 10 year risk and also consider strong family history of coronary disease which he has as a risk factor.  PMHx:  Past Medical History:  Diagnosis Date  . Allergy    seasonal  . Hyperlipidemia   . Hypertension     Past Surgical History:  Procedure Laterality Date  . COLONOSCOPY  01/31/1999  . URETHRAL DILATION      FAMHx:  Family History  Problem Relation Age of Onset  . Alcohol abuse Father   . Hypertension Father   . Hyperlipidemia Father   . Hypertension Mother   . Hyperlipidemia Mother   . Diabetes Maternal Grandmother     SOCHx:   reports that he has quit smoking. He has quit using smokeless tobacco. He reports that he drinks alcohol. He reports that he does not use drugs.  ALLERGIES:  No Known Allergies  ROS: Pertinent items noted in HPI and remainder of comprehensive ROS otherwise negative.  HOME MEDS: Current Outpatient Prescriptions on File Prior to Visit  Medication Sig Dispense Refill  . Cholecalciferol (VITAMIN D) 2000 units CAPS Take 1 capsule by mouth daily.     Marland Kitchen. CINNAMON PO Take 1 tablet  by mouth daily.    . Coenzyme Q10 (CO Q 10) 10 MG CAPS Take 1 tablet by mouth daily.     Marland Kitchen dextromethorphan-guaiFENesin (MUCINEX DM) 30-600 MG 12hr tablet Take 1 tablet by mouth daily as needed for cough.     . Ginkgo Biloba 40 MG TABS Take 1 tablet by mouth daily.     . Glucosamine-Chondroit-Vit C-Mn (GLUCOSAMINE 1500 COMPLEX PO) Take 1 tablet by mouth daily.     Marland Kitchen lisinopril (PRINIVIL,ZESTRIL) 20 MG tablet Take 1 tablet (20 mg total) by mouth daily. 30 tablet 3  . OVER THE COUNTER MEDICATION Take 1 tablet by mouth daily.  Prostate Plus     . Red Yeast Rice Extract (RED YEAST RICE PO) Take 1 tablet by mouth daily.     . traZODone (DESYREL) 50 MG tablet Take 0.5-1 tablets (25-50 mg total) by mouth at bedtime as needed for sleep. 30 tablet 1  . vitamin C (ASCORBIC ACID) 500 MG tablet Take 1,000 mg by mouth daily.     No current facility-administered medications on file prior to visit.     LABS/IMAGING: No results found for this or any previous visit (from the past 48 hour(s)). No results found.  WEIGHTS: Wt Readings from Last 3 Encounters:  12/25/16 228 lb 12.8 oz (103.8 kg)  12/01/16 231 lb (104.8 kg)  11/04/16 234 lb (106.1 kg)    VITALS: BP 118/82   Pulse 64   Ht 6\' 3"  (1.905 m)   Wt 228 lb 12.8 oz (103.8 kg)   BMI 28.60 kg/m   EXAM: Deferred  EKG: Deferred  ASSESSMENT: 1. Vasovagal syncope 2. CAC of 19 (2018) 3. Dyslipidemia 4. Family history of premature coronary disease 5. Essential hypertension-controlled  PLAN: 1.   Mr. Linnemann has a mildly elevated but low risk coronary artery calcium score of 19. His LDL C is 116, and goal should be less than 70 if we were to pursue aggressive risk factor modification based on his family history and 6.5% 10 year risk. I do not believe he will be able to get his cholesterol much lower with aggressive diet and exercise but I have encouraged that. He wants to give a trial 6 months of lifestyle modifications which is reasonable. We'll plan to see him back at that time recheck lipid profile. We may consider placing him on low-dose statin medication at that time.   Chrystie Nose, MD, [facility] Attending Cardiologist [facility]  Chrystie Nose 12/25/2016, 5:29 PM

## 2017-01-24 ENCOUNTER — Other Ambulatory Visit: Payer: Self-pay | Admitting: Family Medicine

## 2017-02-22 ENCOUNTER — Encounter: Payer: Self-pay | Admitting: Family Medicine

## 2017-03-05 ENCOUNTER — Other Ambulatory Visit: Payer: Self-pay | Admitting: Family Medicine

## 2017-03-08 NOTE — Telephone Encounter (Signed)
My chart message  Received: Today  Message Contents  RichardsonDurham, Velna HatchetKawanta F, MD  Six, Durwin Norahristina H, LPN         I received notification he has not read his Mychart medication  He was suppose to reduce the lisinopril 10mg  and f/u in 2 weeks, or send us readings

## 2017-03-08 NOTE — Telephone Encounter (Signed)
Call placed to patient. LMTRC.  

## 2017-03-11 NOTE — Telephone Encounter (Signed)
Call placed to patient. LMTRC.  

## 2017-03-12 MED ORDER — LISINOPRIL 10 MG PO TABS: 10.0000 mg | ORAL_TABLET | Freq: Every day | ORAL | 3 refills | Status: DC

## 2017-03-12 NOTE — Telephone Encounter (Signed)
Call placed to patient and patient made aware.  

## 2017-03-31 ENCOUNTER — Other Ambulatory Visit: Payer: Self-pay | Admitting: Family Medicine

## 2017-05-18 ENCOUNTER — Other Ambulatory Visit: Payer: Self-pay | Admitting: *Deleted

## 2017-05-18 MED ORDER — TRAZODONE HCL 50 MG PO TABS: 25.0000 mg | ORAL_TABLET | Freq: Every evening | ORAL | 1 refills | Status: DC | PRN

## 2017-05-18 MED ORDER — LISINOPRIL 10 MG PO TABS
10.0000 mg | ORAL_TABLET | Freq: Every day | ORAL | 3 refills | Status: DC
Start: 2017-05-18 — End: 2017-05-19

## 2017-05-18 NOTE — Telephone Encounter (Signed)
Received fax requesting refill on lisinopril.   Prescription sent to pharmacy.  

## 2017-05-19 ENCOUNTER — Other Ambulatory Visit: Payer: Self-pay

## 2017-05-19 MED ORDER — LISINOPRIL 10 MG PO TABS: 10.0000 mg | ORAL_TABLET | Freq: Every day | ORAL | 3 refills | Status: DC

## 2017-06-29 IMAGING — CT CT HEAD W/O CM
4 series · 16 of 47 positions shown, 18 images · non-contrast
Comparison: None.

CLINICAL DATA: Syncopal episode.  Dizziness.

EXAM:
CT HEAD WITHOUT CONTRAST
TECHNIQUE: Contiguous axial images were obtained from the base of the skull
through the vertex without intravenous contrast.

[Series 2: head without · axial · non-contrast · 0.45mm/px · z∈[-77,+43]mm · 7 of 32 slices shown, 9 images]
[im 4/32  brain]
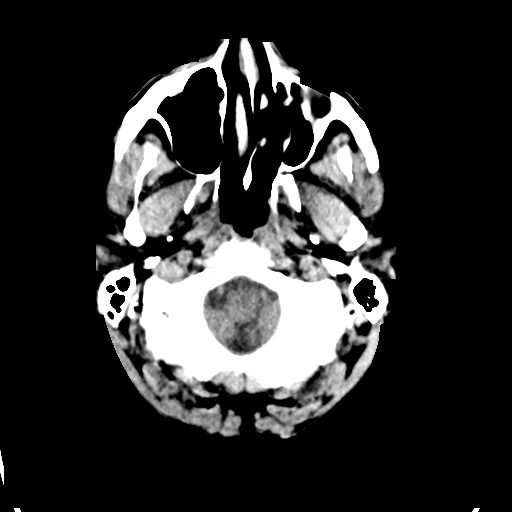
[im 4/32  bone]
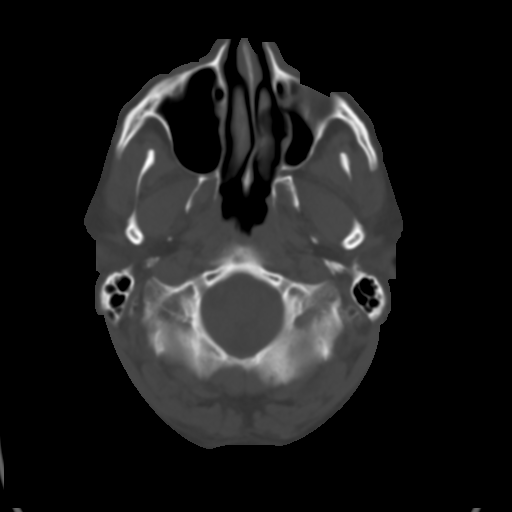
[im 8/32  brain]
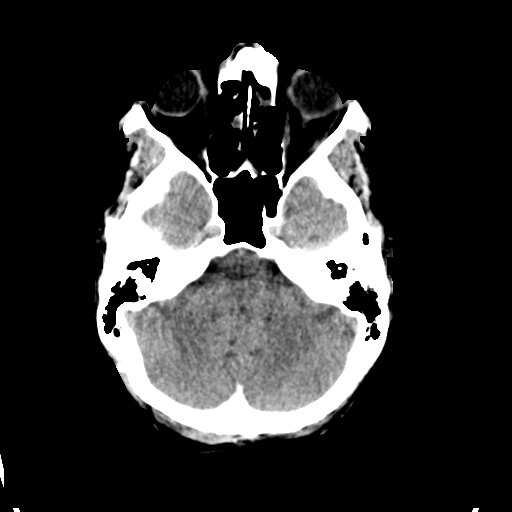
[im 12/32  brain]
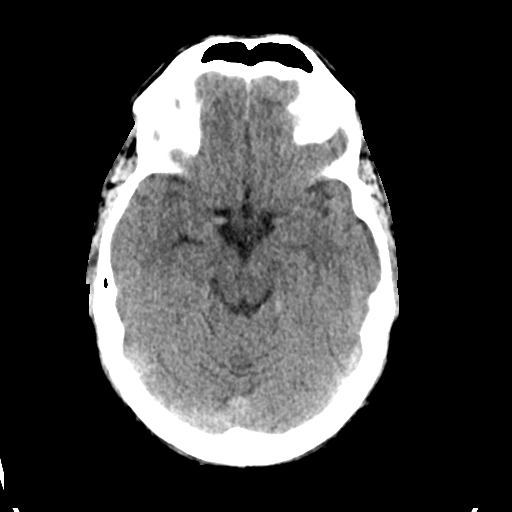
[im 16/32  brain]
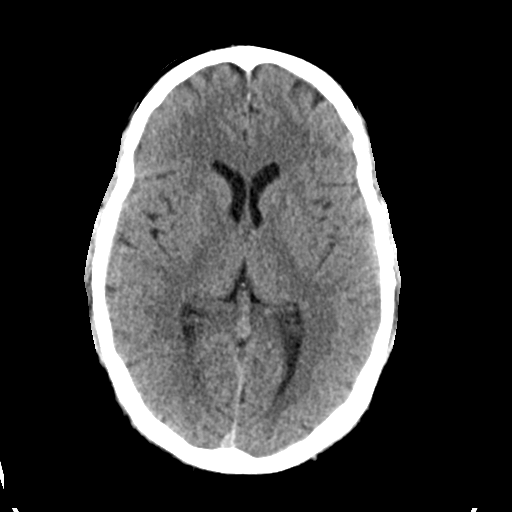
[im 20/32  brain]
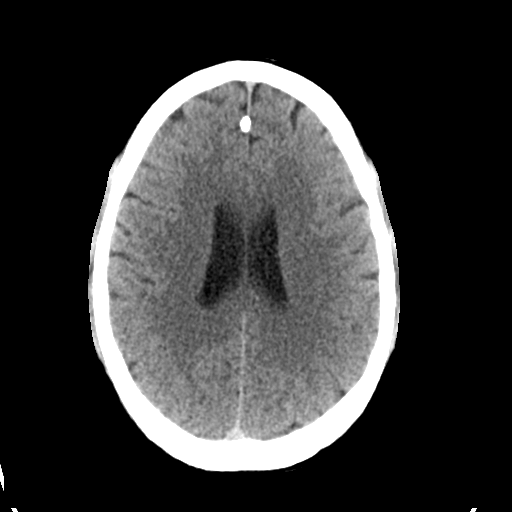
[im 20/32  bone]
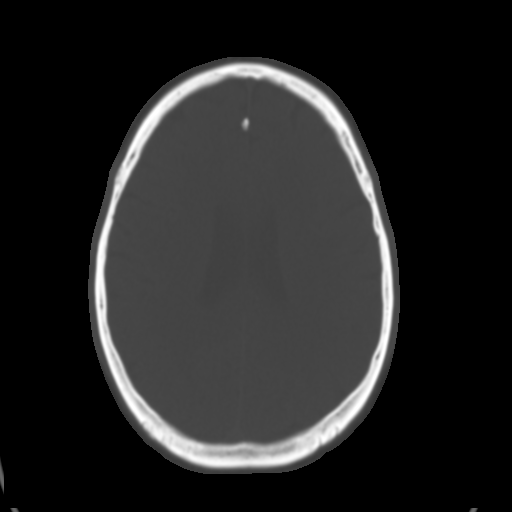
[im 24/32  brain]
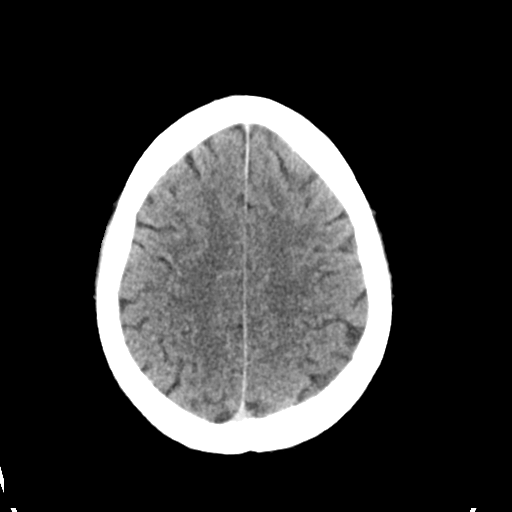
[im 28/32  brain]
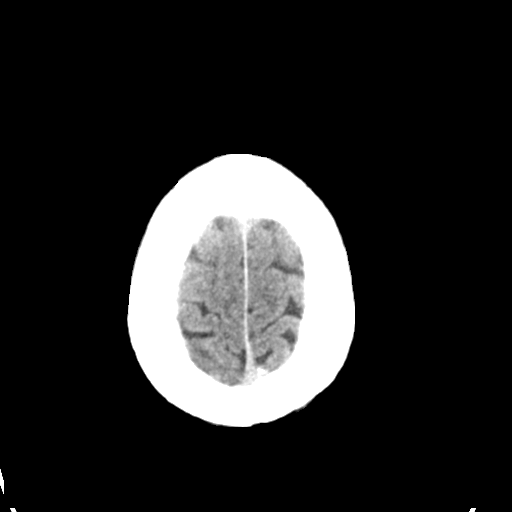

[Series 3: head bone · axial · 0.45mm/px · z∈[-78,-46]mm · 3 of 80 slices shown]
[im 8/80  bone]
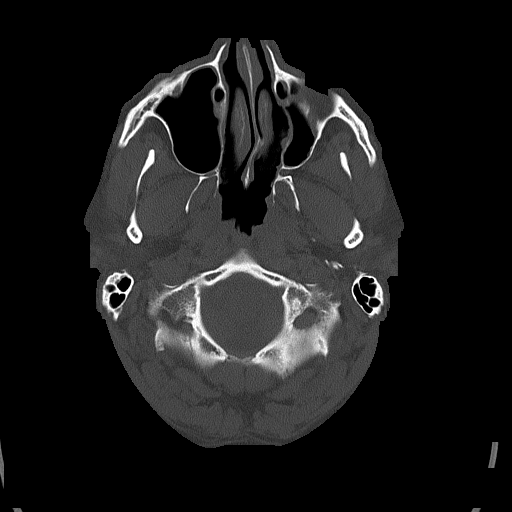
[im 16/80  bone]
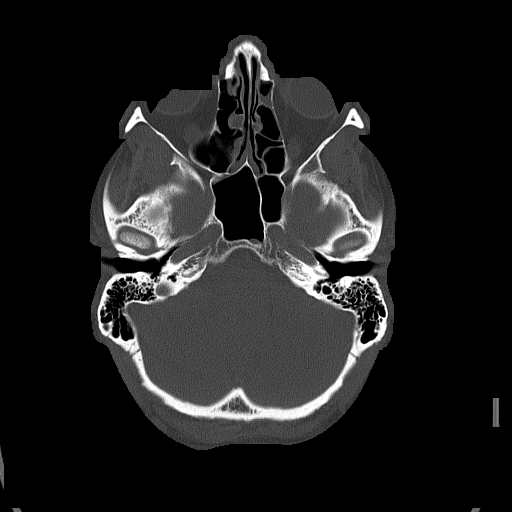
[im 24/80  bone]
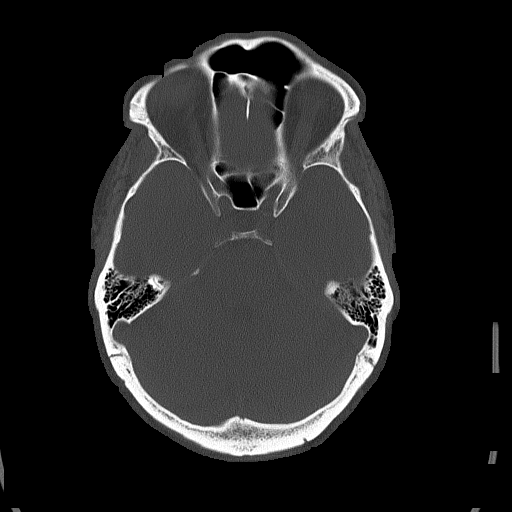

[Series 4: head without cor · coronal · non-contrast · 0.31mm/px · 3 of 67 slices shown]
[im 23/67  brain]
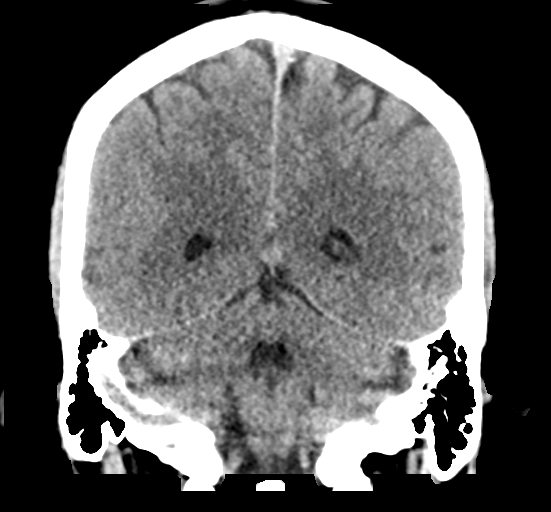
[im 30/67  brain]
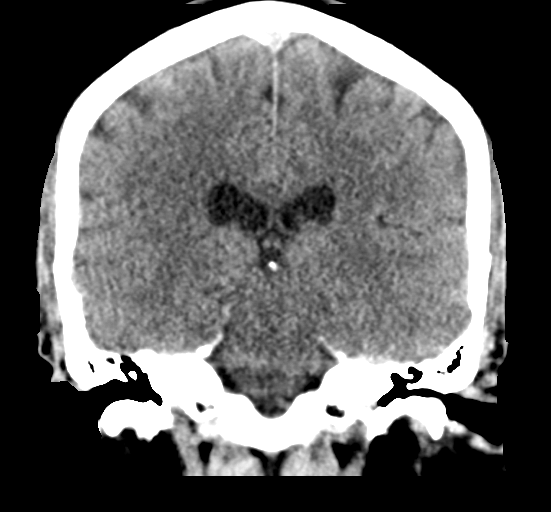
[im 37/67  brain]
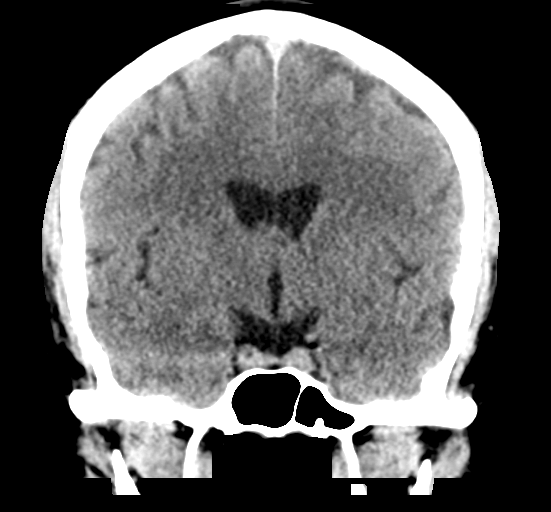

[Series 5: head without sag · sagittal · non-contrast · 0.31mm/px · 3 of 52 slices shown]
[im 18/52  brain]
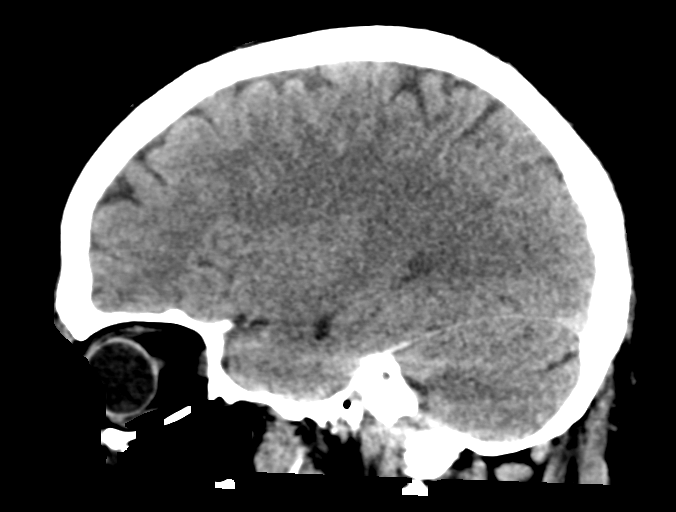
[im 26/52  brain]
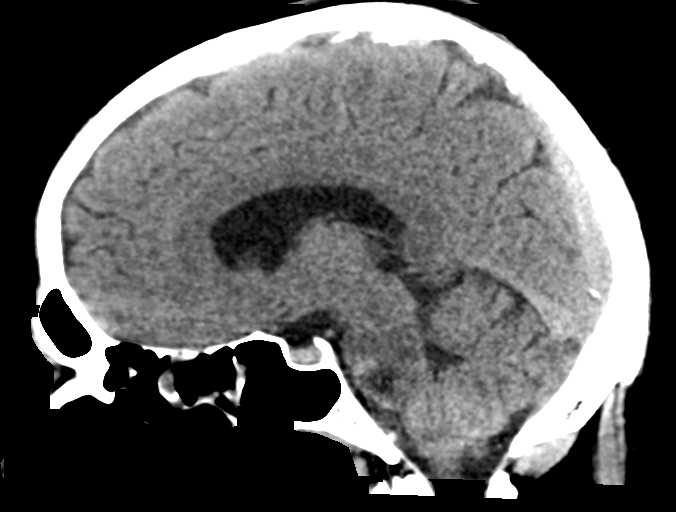
[im 35/52  brain]
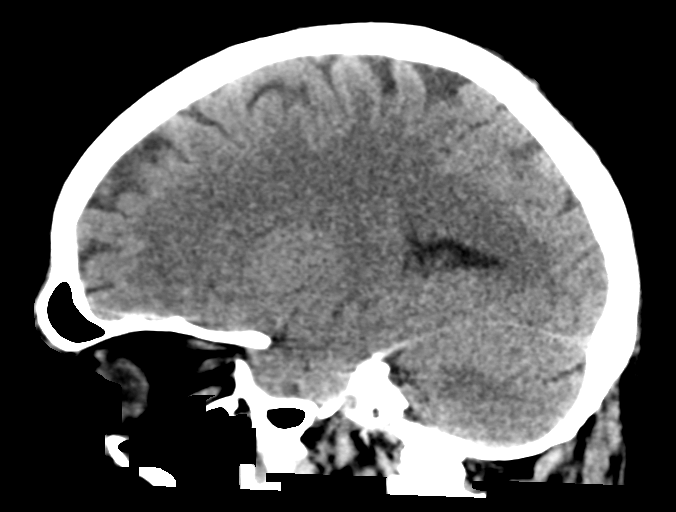

[16 of 47 positions shown; findings below may reference images not displayed]

FINDINGS: Brain:

The ventricles are normal in size and configuration. No extra-axial
fluid collections are identified. The gray-white differentiation is
normal. No CT findings for acute intracranial process such as
hemorrhage or infarction. No mass lesions. The brainstem and
cerebellum are grossly normal.

Vascular: No hyperdense vessel or unexpected calcification.

Skull: No skull lesions or fracture.

Sinuses/Orbits: Moderate mucoperiosteal thickening involving both
maxillary sinuses. The left maxillary sinus is hypoplastic. There is
also scattered ethmoid sinus disease. The frontal sinuses are clear
and the mastoid air cells and middle ear cavities are clear.

The globes are intact.

Other: No scalp lesions or hematoma.
IMPRESSION: No acute intracranial findings or mass lesions.

Scattered paranasal sinus disease.

## 2017-07-16 ENCOUNTER — Encounter: Payer: Self-pay | Admitting: Family Medicine

## 2017-10-09 ENCOUNTER — Inpatient Hospital Stay
Admit: 2017-10-09 | Discharge: 2017-10-09 | Disposition: A | Discharge status: Home / Self Care | Payer: PRIVATE HEALTH INSURANCE

## 2017-10-09 DIAGNOSIS — S39012A Strain of muscle, fascia and tendon of lower back, initial encounter: Secondary | ICD-10-CM

## 2017-10-09 MED ORDER — cyclobenzaprine (FLEXERIL) tablet 10 mg
10 | Freq: Once | ORAL | Status: AC
Start: 2017-10-09 — End: 2017-10-09
  Administered 2017-10-09: 16:00:00 10 mg via ORAL

## 2017-10-09 MED ORDER — cyclobenzaprine (FLEXERIL) 10 MG tablet
10 | ORAL_TABLET | Freq: Three times a day (TID) | ORAL | 0 refills | Status: AC | PRN
Start: 2017-10-09 — End: ?

## 2017-10-09 MED ORDER — naproxen (NAPROSYN) 500 MG tablet
500 | ORAL_TABLET | Freq: Two times a day (BID) | ORAL | 0 refills | Status: AC | PRN
Start: 2017-10-09 — End: ?

## 2017-10-09 MED ORDER — ketorolac (TORADOL) injection 15 mg
15 | Freq: Once | INTRAMUSCULAR | Status: AC
Start: 2017-10-09 — End: 2017-10-09
  Administered 2017-10-09: 16:00:00 15 mg via INTRAMUSCULAR

## 2017-10-09 MED FILL — KETOROLAC 15 MG/ML INJECTION SOLUTION: 15 15 mg/mL | INTRAMUSCULAR | Qty: 1

## 2017-10-09 MED FILL — CYCLOBENZAPRINE 10 MG TABLET: 10 10 MG | ORAL | Qty: 1

## 2017-10-09 NOTE — Unmapped (Signed)
ED Attending Attestation Note    Date of service:  10/09/2017    This patient was seen by the resident physician.  I have seen and examined the patient, agree with the workup, evaluation, management and diagnosis. The care plan has been discussed and I concur.     My assessment reveals a 46 y.o. male with No stool or urinary incontinence. Normal strength and sensation in legs. 5+/5+ motor hip flexor, hamstrings, ankle dorsiflexor/plantar flexor, great toe extensor strength. Negative leg raise. Normal gait. Normal sensation bilaterally.   No symptoms worrisome for cauda equina

## 2017-10-09 NOTE — Unmapped (Signed)
You have been diagnosed with muscular back pain. The pain often gets better over time. About half of people with back pain feel better in just 2 weeks. About 80% of people feel better by 6 weeks. Sometimes it will go away and then come back.    Taking care of yourself at home:  Do not avoid exercise or work.  Your back will likely heal faster if you return to being active before your pain is gone. Do not sit, drive, or stand in one place for more than 30 minutes at a time.  Do not stay in bed during the day. Resting more than 1 or 2 days can delay your recovery.  Find a comfortable position to sleep.  Lie on your side with your knees slightly bent.  If you lie on your back, put a pillow under your knees.  Take ibuprofen if you are able to. This reduces both pain and inflammation and is often the most helpful medication.  Stay well-hydrated  Put ice on the injured area for 15-20 minutes, 3-4 times a day for the first 2 to 3 days. After that, ice and heat may be alternated to reduce pain and spasms.  Call your doctor or come back to the emergency department if:  You have pain that does not improve in 1 week.  You have new symptoms.  You have pain that radiates from your back into your legs.  You develop problems peeing or having bowel movements.  You have new weakness or numbness in your arms or legs.  You develop nausea or vomiting.  You develop abdominal pain.

## 2017-10-09 NOTE — Unmapped (Signed)
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 Pt has no injury

## 2017-10-09 NOTE — Unmapped (Signed)
Pt has been d/c, instructions and prescriptions given pt had no questions

## 2017-10-09 NOTE — Unmapped (Signed)
[place] ED Note    Date of service:  10/09/2017    Reason for Visit: Back Pain    Patient History     HPI:  This is a 46 y.o. male with a history as noted below presenting with back pain.  History is obtained from the patient.     The patient presents due to atraumatic back pain.  Onset of their pain was about 3 days ago.  At the time of onset, they report that they were bending over to pick up a glass of water from the fridge.  He states that in the past week he has had 2 plane trips that have caused his back to be irritated and he has not slept on his own bed.  There was no MVC, fall, or other trauma.  The location of the pain is in the lower back and the character of the pain is squeezing without radiation down the legs.  They have tried taking ibuprofen for their pain.  The pain is made worse by sitting down or trying to stand up.  They do have a history of this pain in the past.  There are no other aggravating or alleviating factors.  They deny any personal history of malignancy.  No fevers, chills, or IVDU.  No difficulty with urination or defecation.      History reviewed. No pertinent past medical history.    History reviewed. No pertinent surgical history.    Stephen Mcfarland  reports that he has never smoked. He has never used smokeless tobacco. He reports that he drinks alcohol. He reports that he does not use drugs.    Previous Medications    TRAZODONE (DESYREL) 50 MG TABLET    Take 50 mg by mouth at bedtime.       Allergies:   Allergies as of 10/09/2017   ??? (No Known Allergies)     PMH: Nursing notes reviewed   PSH: Nursing notes reviewed   FH: Nursing notes reviewed   MEDS: Nursing notes and chart reviewed     Review of Systems     A full, ten-point review of systems was performed. Notable findings per HPI. All other pertinent systems reviewed and negative.  Physical Exam     Vitals:    10/09/17 1012   BP: (!) 163/101   BP Location: Right arm   Patient Position: Lying   Pulse: 58   Resp: 16   Temp: 97.8  ??F (36.6 ??C)   TempSrc: Oral   SpO2: 99%   Weight: 222 lb (100.7 kg)   Height: 6' 3 (1.905 m)     General: Nontoxic, well nourished; well developed; in no apparent distress  Eyes: Pupils equal, round and reactive. Extra ocular movements intact.  HENT: Normocephalic, atraumatic, no rhinorrhea, no oral or lingular edema, uvula midline, no tonsillar exudates or edema.   Neck: Neck supple, trachea midline, no meningismus   Pulmonary: Lung sounds clear to auscultation bilaterally with good air entry; no respiratory distress; no wheezes or rales.   Cardiovascular: Regular rate and rhythm with no murmurs, rubs, or gallops; 2+ bilateral upper extremity peripheral pulses.   Chest: No chest wall trauma. No tenderness to palpation or percussion.   Abdomen: Soft, non-distended, non-tender to palpation with no rebound or guarding.  Musculoskeletal: Atraumatic exam with no focal swelling or tenderness, no peripheral edema. ROM intact in all 4 extremities. No midline tenderness to palpation in the lumbar spine. Normal gail.  Skin: Warm and well  perfused without rashes or lesions.  Neuro: Alert and oriented, CN II-XII grossly intact bilaterally. Normal speech, strength equal in all four extremities  Psych: Appropriate mood and affect to the clinical situation  Diagnostic Studies   Labs:  No tests were performed during this ED visit     Radiology:  No orders to display     EKG:  Not performed    Emergency Department Procedures     Procedures    No ED procedure performed     ED Course and MDM     Stephen Mcfarland is a 46 y.o. male with a history and presentation as described above in HPI.  The patient was evaluated by myself and the ED Attending Physician, Dr. Helen Hashimoto. All management and disposition plans were discussed and agreed upon. Nursing documentation reviewed.     Based on our evaluation, the patient's presentation is most consistent with musculoskeletal back pain. The patient is neurologically intact, including normal  sensation in the lower extremities, and their dorsalis pedal pulses 2+ bilaterally. There is no signs of cauda equina syndrome or cord compression on our evaluation. We have a very low suspicion of epidural abscess/hematoma or other signs of vascular emergency contributing to the patient's pain given the patient's history. There are no signs on exam of acute neurologic compromise. There is no history of recent trauma that would make [place] concerned for acute fracture. We have counseled the patient on appropriate stretching and strengthening exercises to help control symptoms and encouraged follow-up as an outpatient after strict return precautions were provided.     Medications received during this ED visit:  Medications   ketorolac (TORADOL) injection 15 mg (not administered)   cyclobenzaprine (FLEXERIL) tablet 10 mg (not administered)       At this time, the patient was deemed appropriate for discharge. My customary discharge instructions, including strict return precautions for new or worsening symptoms concerning to the patient, were provided. All of patient's questions were answered satisfactorily, and he was subsequently sent home in stable condition.    Impression     1. Back strain, initial encounter       Plan     1. The patient is to be discharged home in stable/improved condition.  2. Workup, treatment and diagnosis were discussed with the patient and/or family members; the patient agrees to the plan and all questions were addressed and answered.  3. The patient is instructed to return to the emergency department should his symptoms worsen or any concern he believes warrants acute physician evaluation.    Jae Dire., MD, PGY-3  [facility]    Critical Care Time (Attendings)          Jae Dire, MD  Resident  10/09/17 [phone]

## 2017-12-13 ENCOUNTER — Other Ambulatory Visit: Payer: Self-pay

## 2017-12-13 ENCOUNTER — Other Ambulatory Visit: Payer: 59

## 2017-12-13 DIAGNOSIS — Z0000 Encounter for general adult medical examination without abnormal findings: Secondary | ICD-10-CM

## 2017-12-13 LAB — CBC WITH DIFFERENTIAL/PLATELET
BASOS ABS: 52 {cells}/uL (ref 0–200)
Basophils Relative: 0.9 %
EOS ABS: 400 {cells}/uL (ref 15–500)
Eosinophils Relative: 6.9 %
HEMATOCRIT: 39.4 % (ref 38.5–50.0)
Hemoglobin: 13.5 g/dL (ref 13.2–17.1)
LYMPHS ABS: 2076 {cells}/uL (ref 850–3900)
MCH: 30.9 pg (ref 27.0–33.0)
MCHC: 34.3 g/dL (ref 32.0–36.0)
MCV: 90.2 fL (ref 80.0–100.0)
MPV: 9.3 fL (ref 7.5–12.5)
Monocytes Relative: 6.4 %
NEUTROS PCT: 50 %
Neutro Abs: 2900 cells/uL (ref 1500–7800)
Platelets: 252 10*3/uL (ref 140–400)
RBC: 4.37 10*6/uL (ref 4.20–5.80)
RDW: 12.4 % (ref 11.0–15.0)
Total Lymphocyte: 35.8 %
WBC mixed population: 371 cells/uL (ref 200–950)
WBC: 5.8 10*3/uL (ref 3.8–10.8)

## 2017-12-13 LAB — COMPLETE METABOLIC PANEL WITH GFR
AG Ratio: 1.8 (calc) (ref 1.0–2.5)
ALT: 13 U/L (ref 9–46)
AST: 18 U/L (ref 10–40)
Albumin: 4.3 g/dL (ref 3.6–5.1)
Alkaline phosphatase (APISO): 48 U/L (ref 40–115)
BILIRUBIN TOTAL: 0.5 mg/dL (ref 0.2–1.2)
BUN/Creatinine Ratio: 15 (calc) (ref 6–22)
BUN: 21 mg/dL (ref 7–25)
CALCIUM: 9.4 mg/dL (ref 8.6–10.3)
CHLORIDE: 105 mmol/L (ref 98–110)
CO2: 28 mmol/L (ref 20–32)
Creat: 1.42 mg/dL — ABNORMAL HIGH (ref 0.60–1.35)
GFR, EST AFRICAN AMERICAN: 67 mL/min/{1.73_m2} (ref >=60)
GFR, Est Non African American: 58 mL/min/{1.73_m2} — ABNORMAL LOW (ref >=60)
GLUCOSE: 95 mg/dL (ref 65–99)
Globulin: 2.4 g/dL (calc) (ref 1.9–3.7)
Potassium: 4.8 mmol/L (ref 3.5–5.3)
Sodium: 141 mmol/L (ref 135–146)
TOTAL PROTEIN: 6.7 g/dL (ref 6.1–8.1)

## 2017-12-13 LAB — LIPID PANEL
Cholesterol: 197 mg/dL (ref <200)
HDL: 77 mg/dL (ref >40)
LDL CHOLESTEROL (CALC): 104 mg/dL — AB
Non-HDL Cholesterol (Calc): 120 mg/dL (calc) (ref <130)
TRIGLYCERIDES: 75 mg/dL (ref <150)
Total CHOL/HDL Ratio: 2.6 (calc) (ref <5.0)

## 2017-12-13 LAB — TSH: TSH: 2.78 m[IU]/L (ref 0.40–4.50)

## 2017-12-15 ENCOUNTER — Other Ambulatory Visit: Payer: Self-pay

## 2017-12-15 ENCOUNTER — Ambulatory Visit (INDEPENDENT_AMBULATORY_CARE_PROVIDER_SITE_OTHER): Payer: 59 | Admitting: Family Medicine

## 2017-12-15 ENCOUNTER — Encounter: Payer: Self-pay | Admitting: Family Medicine

## 2017-12-15 VITALS — BP 124/70 | HR 62 | Temp 98.4°F | Resp 14 | Ht 75.0 in | Wt 225.0 lb

## 2017-12-15 DIAGNOSIS — M25552 Pain in left hip: Secondary | ICD-10-CM | POA: Diagnosis not present

## 2017-12-15 DIAGNOSIS — Z0000 Encounter for general adult medical examination without abnormal findings: Secondary | ICD-10-CM

## 2017-12-15 DIAGNOSIS — I10 Essential (primary) hypertension: Secondary | ICD-10-CM | POA: Diagnosis not present

## 2017-12-15 DIAGNOSIS — M25551 Pain in right hip: Secondary | ICD-10-CM

## 2017-12-15 DIAGNOSIS — G8929 Other chronic pain: Secondary | ICD-10-CM

## 2017-12-15 DIAGNOSIS — M25561 Pain in right knee: Secondary | ICD-10-CM

## 2017-12-15 DIAGNOSIS — E782 Mixed hyperlipidemia: Secondary | ICD-10-CM | POA: Diagnosis not present

## 2017-12-15 NOTE — Assessment & Plan Note (Signed)
improved, declines statins, treating with diet, exercise, he understands risk with family

## 2017-12-15 NOTE — Progress Notes (Signed)
Subjective:    Patient ID: Christopher Keller, male    DOB: 14-May-1971, 47 y.o.   MRN: [id number]  Patient presents for CPE (has had labs)  Patient here for complete physical exam.  His recent fasting labs reviewed The past 6 months or so been tapering down off of his lisinopril as he has been watching his diet and losing weight.  He came off his 10mg  of lisinopril in December. BP has still been 117-120's/ 70-80's    His LDL was minimally elevated at 104 this is improved from 116 last year.  He is currently taking red yeast rice. was seen by cardiology last year he has strong family history of coronary artery disease.  It was recommended that his LDL be below 70 and possibility of addition of statin but he did not follow-up at the 3220-month mark.  Did have a normal exercise stress test  His labs did show  some mild renal insufficiency creatinine was 1.42 with a GFR of 58, he admits day before had a few alcoholic beverages at an event then fasting for about 18 hours was a little dehydrated.  Continues to use trazodone for sleep during the work week, trying to wean himself off   Continues to have bilat hip pain, Right knee pain. Has had episode where is back locks up, feels like muscles, no radicular symptoms. Last occurred in December while in [place] seen at local hospital given muscle relaxer and NSAID. Then he saw a chiropracter with minimal relief. Stretching has helped. He also has chronic hip pain, has had since military  No pain currently, runs on treadmill.  Wants to talk to specialist about his joints more for what to do in the future, does not want to treat with injections or medications if possible   Tetanus is up-to-date he declines influenza  Review Of Systems:  GEN- denies fatigue, fever, weight loss,weakness, recent illness HEENT- denies eye drainage, change in vision, nasal discharge, CVS- denies chest pain, palpitations RESP- denies SOB, cough, wheeze ABD- denies N/V, change  in stools, abd pain GU- denies dysuria, hematuria, dribbling, incontinence MSK-+ joint pain, muscle aches, injury Neuro- denies headache, dizziness, syncope, seizure activity       Objective:    BP 124/70   Pulse 62   Temp 98.4 F (36.9 C) (Oral)   Resp 14   Ht 6\' 3"  (1.905 m)   Wt 225 lb (102.1 kg)   SpO2 98%   BMI 28.12 kg/m  GEN- NAD, alert and oriented x3 HEENT- PERRL, EOMI, non injected sclera, pink conjunctiva, MMM, oropharynx clear Neck- Supple, no thyromegaly CVS- RRR, no murmur RESP-CTAB ABD-NABS,soft,NT,ND MSK- FROM spine, bilat Knees- good ROM, no effusion EXT- No edema Pulses- Radial, DP- 2+        Assessment & Plan:      Problem List Items Addressed This Visit      Unprioritized   Hyperlipidemia    improved, declines statins, treating with diet, exercise, he understands risk with family      Essential hypertension    Controlled, monitor off meds       Other Visit Diagnoses    Routine general medical examination at a health care facility    -  Primary   CPE done, declines flu shot. Reviewed fasting labs, very mild renal insuffiency likley from being dehydrated wtih ETOH day before, will have rechecked at work   Chronic pain of right knee       Some function knee pain,  had xray left knee no OA. He wants more mechanical things to do to help in future. Send to sports medicine   Relevant Orders   Ambulatory referral to Sports Medicine   Bilateral hip pain       Relevant Orders   Ambulatory referral to Sports Medicine      Note: This dictation was prepared with Dragon dictation along with smaller phrase technology. Any transcriptional errors that result from this process are unintentional.

## 2017-12-15 NOTE — Assessment & Plan Note (Signed)
Controlled, monitor off meds

## 2017-12-15 NOTE — Patient Instructions (Addendum)
F/U 1 year  Sports Medicine Referral

## 2018-01-11 ENCOUNTER — Ambulatory Visit: Payer: Self-pay

## 2018-01-11 ENCOUNTER — Ambulatory Visit (INDEPENDENT_AMBULATORY_CARE_PROVIDER_SITE_OTHER): Payer: 59 | Admitting: Family Medicine

## 2018-01-11 ENCOUNTER — Ambulatory Visit (INDEPENDENT_AMBULATORY_CARE_PROVIDER_SITE_OTHER)
Admission: RE | Admit: 2018-01-11 | Discharge: 2018-01-11 | Disposition: A | Discharge status: Home / Self Care | Payer: 59 | Source: Ambulatory Visit | Attending: Family Medicine | Admitting: Family Medicine

## 2018-01-11 ENCOUNTER — Encounter: Payer: Self-pay | Admitting: Family Medicine

## 2018-01-11 ENCOUNTER — Other Ambulatory Visit (INDEPENDENT_AMBULATORY_CARE_PROVIDER_SITE_OTHER): Payer: 59

## 2018-01-11 VITALS — BP 160/100 | HR 62 | Ht 75.0 in | Wt 227.0 lb

## 2018-01-11 DIAGNOSIS — G8929 Other chronic pain: Secondary | ICD-10-CM | POA: Diagnosis not present

## 2018-01-11 DIAGNOSIS — M25461 Effusion, right knee: Secondary | ICD-10-CM | POA: Diagnosis not present

## 2018-01-11 DIAGNOSIS — M2550 Pain in unspecified joint: Secondary | ICD-10-CM

## 2018-01-11 DIAGNOSIS — M25562 Pain in left knee: Secondary | ICD-10-CM

## 2018-01-11 DIAGNOSIS — M25462 Effusion, left knee: Secondary | ICD-10-CM | POA: Diagnosis not present

## 2018-01-11 DIAGNOSIS — M25561 Pain in right knee: Secondary | ICD-10-CM | POA: Diagnosis not present

## 2018-01-11 LAB — TESTOSTERONE: TESTOSTERONE: 351.92 ng/dL (ref 300.00–890.00)

## 2018-01-11 LAB — IBC PANEL
Iron: 54 ug/dL (ref 42–165)
SATURATION RATIOS: 14.4 % — AB (ref 20.0–50.0)
TRANSFERRIN: 267 mg/dL (ref 212.0–360.0)

## 2018-01-11 LAB — VITAMIN D 25 HYDROXY (VIT D DEFICIENCY, FRACTURES): VITD: 44.32 ng/mL (ref 30.00–100.00)

## 2018-01-11 LAB — URIC ACID: URIC ACID, SERUM: 5.7 mg/dL (ref 4.0–7.8)

## 2018-01-11 LAB — SEDIMENTATION RATE: Sed Rate: 5 mm/hr (ref 0–15)

## 2018-01-11 NOTE — Progress Notes (Signed)
Christopher Keller D.O. [facility] [address] [place], [place] [address] Phone: [phone] Subjective:      CC: knee pain, hip pain, back pain   BJY:NWGNFAOZHYHPI:Subjective  Christopher FramesBryan Keller is a 47 y.o. male coming in with complaint of right hip pain for 9 months. Patient was water skiing when his right leg abducted against the water. Pain is deep and felt with flexion and can radiate into his groin. Overall this pain has improved as well.    Patient ran 340 miles last year and his knees were bothering him but have improved. Patient continues to run but is using a slower approach. One the right knee the quad tendon bothers him. One the left knee he has pain in the patellar tendon.   His lower back also bothers him. He uses icy hot to alleviate his pain. He said that twice a year he "blows his back out." His last bout of pain was in November and he went to the emergency room. Patient feels pain after exercising when doing stretching. He feels like he needs to extend his back to alleviate the pain. Patient runs on the treadmill, elliptical and does strength. Patient does core as well doing planks, crunches, sit ups.       Past Medical History:  Diagnosis Date  . Allergy    seasonal  . Hyperlipidemia   . Hypertension    Past Surgical History:  Procedure Laterality Date  . COLONOSCOPY  01/31/1999  . URETHRAL DILATION     Social History   Socioeconomic History  . Marital status: Married    Spouse name: None  . Number of children: None  . Years of education: None  . Highest education level: None  Social Needs  . Financial resource strain: None  . Food insecurity - worry: None  . Food insecurity - inability: None  . Transportation needs - medical: None  . Transportation needs - non-medical: None  Occupational History  . None  Tobacco Use  . Smoking status: Former Games developermoker  . Smokeless tobacco: Former Engineer, waterUser  Substance and Sexual Activity  . Alcohol use: Yes    Alcohol/week: 0.0 oz      Comment: occasionally   . Drug use: No  . Sexual activity: Yes  Other Topics Concern  . None  Social History Narrative  . None   No Known Allergies Family History  Problem Relation Age of Onset  . Alcohol abuse Father   . Hypertension Father   . Hyperlipidemia Father   . Heart disease Father   . Hypertension Mother   . Hyperlipidemia Mother   . Kidney disease Mother   . Diabetes Maternal Grandmother      Past medical history, social, surgical and family history all reviewed in electronic medical record.  No pertanent information unless stated regarding to the chief complaint.   Review of Systems:Review of systems updated and as accurate as of 01/11/18  No headache, visual changes, nausea, vomiting, diarrhea, constipation, dizziness, abdominal pain, skin rash, fevers, chills, night sweats, weight loss, swollen lymph nodes, body aches, joint swelling, muscle aches, chest pain, shortness of breath, mood changes. Positive muscle aches.   Objective  Blood pressure (!) 160/100, pulse 62, height 6\' 3"  (1.905 m), weight 227 lb (103 kg), SpO2 98 %. Systems examined below as of 01/11/18   General: No apparent distress alert and oriented x3 mood and affect normal, dressed appropriately.  HEENT: Pupils equal, extraocular movements intact  Respiratory: Patient's speak in full  sentences and does not appear short of breath  Cardiovascular: No lower extremity edema, non tender, no erythema  Skin: Warm dry intact with no signs of infection or rash on extremities or on axial skeleton.  Abdomen: Soft nontender  Neuro: Cranial nerves II through XII are intact, neurovascularly intact in all extremities with 2+ DTRs and 2+ pulses.  Lymph: No lymphadenopathy of posterior or anterior cervical chain or axillae bilaterally.  Gait normal with good balance and coordination.  MSK:  Non tender with full range of motion and good stability and symmetric strength and tone of shoulders, elbows, wrist, hip  and ankles bilaterally.  Knee: Bilateral Normal to inspection but does have trace effusion bilaterally Palpation shows mild tenderness over the patellofemoral joint laterally ROM full in flexion and extension and lower leg rotation. Ligaments with solid consistent endpoints including ACL, PCL, LCL, MCL. Negative Mcmurray's, Apley's, and Thessalonian tests. Mild painful patellar compression. Patellar glide without crepitus. Patellar and quadriceps tendons unremarkable. Hamstring and quadriceps strength is normal.  MSK US performed of: Bilateral knee  this study was ordered, performed, and interpreted by Terrilee Files D.O.  Knee: Joint effusion bilaterally.  Patient does have what appears to be more of a synovitis also on the right knee.  Seems to be worse on the right knee overall.  Very minimal osteoarthritic changes noted.  IMPRESSION: Nonspecific knee effusions bilaterally.  [id number]; 15 additional minutes spent for Therapeutic exercises as stated in above notes.  This included exercises focusing on stretching, strengthening, with significant focus on eccentric aspects.   Long term goals include an improvement in range of motion, strength, endurance as well as avoiding reinjury. Patient's frequency would include in 1-2 times a day, 3-5 times a week for a duration of 6-12 weeks. Patellofemoral Syndrome  Reviewed anatomy using anatomical model and how PFS occurs.  Given rehab exercises handout for VMO, hip abductors, core, entire kinetic chain including proprioception exercises including cone touches, step downs, hip elevations and turn outs.  Could benefit from PT, regular exercise, upright biking, and a PFS knee brace to assist with tracking abnormalities.  Proper technique shown and discussed handout in great detail with ATC.  All questions were discussed and answered.     Impression and Recommendations:     This case required medical decision making of moderate complexity.       Note: This dictation was prepared with Dragon dictation along with smaller phrase technology. Any transcriptional errors that result from this process are unintentional.

## 2018-01-11 NOTE — Patient Instructions (Addendum)
Good to see you  Ice is your friend Ice 20 minutes 2 times daily. Usually after activity and before bed. Labs and xrays downstairs.  Stay active.  Exercises 3 times a week.  OK for elliptical, biking or machine weights no high impact for now.  Once weekly vitmain D for 12 weeks Over the counter get  Turmeric 500mg  daily  Tart cherry extract in pill form any dose and take at night Stop the calcium and stop the glucosamine.  See me again in 3 weeks

## 2018-01-11 NOTE — Assessment & Plan Note (Signed)
Patient does have bilateral knee effusions with what appears to be increasing polyarthralgia.  This is fairly rare in an individual of his age.  Especially when there is no significant osteoarthritic changes.  X-rays ordered today and laboratory workup for polyarthralgia done.  We discussed home exercise, topical anti-inflammatories, icing regimen.  Possibility could be gout.  Discussed the possibility of injections but instead will try conservative therapy first.  See how patient responds and follow-up with me again in 4 weeks

## 2018-01-13 ENCOUNTER — Encounter: Payer: Self-pay | Admitting: Family Medicine

## 2018-01-13 LAB — ANA: Anti Nuclear Antibody(ANA): NEGATIVE

## 2018-01-13 LAB — RHEUMATOID FACTOR: Rhuematoid fact SerPl-aCnc: <14 IU/mL (ref <14)

## 2018-01-14 MED ORDER — VITAMIN D (ERGOCALCIFEROL) 1.25 MG (50000 UNIT) PO CAPS: 50000.0000 [IU] | ORAL_CAPSULE | ORAL | 0 refills | Status: DC

## 2018-01-17 ENCOUNTER — Encounter: Payer: Self-pay | Admitting: Family Medicine

## 2018-02-09 ENCOUNTER — Encounter: Payer: Self-pay | Admitting: Family Medicine

## 2018-02-10 ENCOUNTER — Ambulatory Visit: Payer: 59 | Admitting: Family Medicine

## 2018-03-23 ENCOUNTER — Other Ambulatory Visit: Payer: Self-pay | Admitting: Family Medicine

## 2018-03-31 ENCOUNTER — Other Ambulatory Visit: Payer: Self-pay | Admitting: Family Medicine

## 2018-06-24 ENCOUNTER — Other Ambulatory Visit: Payer: Self-pay | Admitting: Family Medicine

## 2018-06-24 NOTE — Telephone Encounter (Signed)
Refill done.  

## 2018-06-25 ENCOUNTER — Other Ambulatory Visit: Payer: Self-pay | Admitting: Family Medicine

## 2018-09-11 IMAGING — DX DG KNEE STANDING AP BILAT
1 series · 1 of 1 positions shown · non-contrast
Comparison: None.

CLINICAL DATA: The bilateral pain and swelling. Pain above the
patella on the right and below the patella on the left.

EXAM:
BILATERAL KNEES STANDING - 1 VIEW

[knee ap]
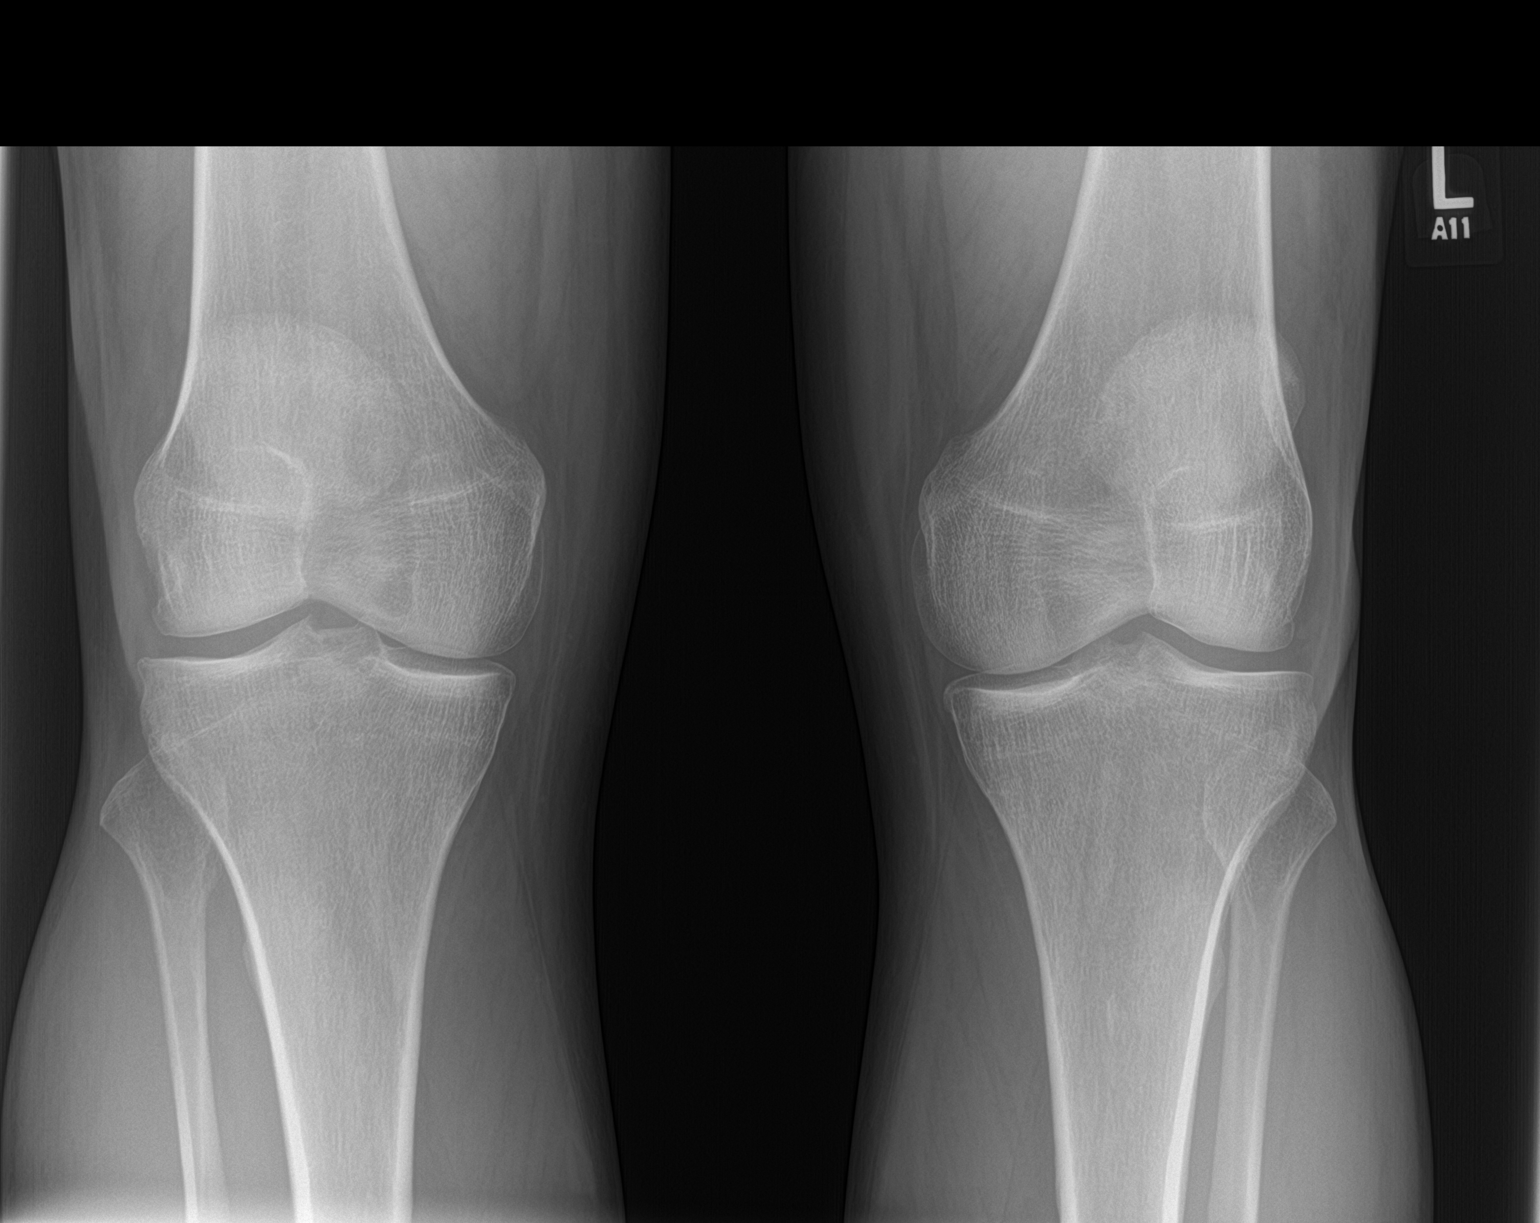

[1 of 1 positions shown; findings below may reference images not displayed]

FINDINGS: No evidence of fracture, dislocation, or joint effusion. No evidence
of arthropathy or other focal bone abnormality. Soft tissues are
unremarkable.
IMPRESSION: Negative AP view of both knees weight-bearing.

## 2018-09-21 ENCOUNTER — Other Ambulatory Visit: Payer: Self-pay | Admitting: Family Medicine

## 2018-11-29 ENCOUNTER — Inpatient Hospital Stay
Admit: 2018-11-29 | Discharge: 2018-11-29 | Disposition: A | Discharge status: Home / Self Care | Payer: PRIVATE HEALTH INSURANCE

## 2018-11-29 DIAGNOSIS — S39012A Strain of muscle, fascia and tendon of lower back, initial encounter: Secondary | ICD-10-CM

## 2018-11-29 MED ORDER — naproxen (NAPROSYN) 500 MG tablet
500 | ORAL_TABLET | Freq: Two times a day (BID) | ORAL | 0 refills | Status: AC | PRN
Start: 2018-11-29 — End: ?

## 2018-11-29 MED ORDER — ketorolac (TORADOL) injection 15 mg
15 | Freq: Once | INTRAMUSCULAR | Status: AC
Start: 2018-11-29 — End: 2018-11-29
  Administered 2018-11-29: 22:00:00 15 mg via INTRAMUSCULAR

## 2018-11-29 MED ORDER — lidocaine (SALONPAS, LIDOCAINE,) 4 % PtMd
4 | MEDICATED_PATCH | Freq: Two times a day (BID) | TOPICAL | 0 refills | 30.00000 days | Status: AC | PRN
Start: 2018-11-29 — End: ?

## 2018-11-29 MED ORDER — lidocaine (LIDODERM) 5 % 1 patch
5 | Freq: Once | TOPICAL | Status: AC
Start: 2018-11-29 — End: 2018-11-29
  Administered 2018-11-29: 22:00:00 1 via TRANSDERMAL

## 2018-11-29 MED FILL — KETOROLAC 15 MG/ML INJECTION SOLUTION: 15 15 mg/mL | INTRAMUSCULAR | Qty: 1

## 2018-11-29 MED FILL — LIDODERM 5 % TOPICAL PATCH: 5 5 % | TOPICAL | Qty: 1

## 2018-11-29 NOTE — Unmapped (Signed)
[facility] ED Note    11/29/2018    Patient History     HPI: Stephen Mcfarland is a 48 y.o. male who presents with Back Spasms  The pt reports last week he began to have minor left sided low back pain that has only worsened since this time. Specifically, 3 days ago the pt sat in a certain way that aggravated his back pain and he now arrives into the ED with associated back spasms. His current pain does not radiate. Pain is worsened with certain movement and slightly relieved with rest. Pt has a history of left sided lower back muscle spasms and states his current symptoms are identical in nature. He has been seen into the ED in the past for these spasms and has received a Toradol injection and flexeril with relief of symptoms. There are no other reported complaints at this time. No recent trauma, weakness, numbness, tingling, incontinence, urinary symptoms, N/V/D, chest pain, difficulty breathing, fever, chills, cough or cold symptoms.      History reviewed. No pertinent past medical history.    History reviewed. No pertinent surgical history.     reports that he has never smoked. He has never used smokeless tobacco. He reports current alcohol use. He reports that he does not use drugs.    Previous Medications    CYCLOBENZAPRINE (FLEXERIL) 10 MG TABLET    Take 1 tablet (10 mg total) by mouth 3 times a day as needed for Muscle spasms.    NAPROXEN (NAPROSYN) 500 MG TABLET    Take 1 tablet (500 mg total) by mouth 2 times a day as needed for Pain.    TRAZODONE (DESYREL) 50 MG TABLET    Take 50 mg by mouth at bedtime.       Allergies:   Allergies as of 11/29/2018   ??? (No Known Allergies)       Review of Systems     ROS: See HPI for pertinent positives  All other ROS were negative.    Physical Exam     ED Triage Vitals [11/29/18 1501]   Vital Signs Group      Temp 98.8 ??F (37.1 ??C)      Temp Source Oral      Heart Rate 61      Heart Rate Source Monitor      Resp 18      SpO2 100 %      BP 139/89      MAP (mmHg)       BP Location  Left arm      BP Method Automatic      Patient Position Sitting   SpO2 100 %   O2 Device None (Room air)     Vitals:    11/29/18 1501   BP: 139/89   BP Location: Left arm   Patient Position: Sitting   Pulse: 61   Resp: 18   Temp: 98.8 ??F (37.1 ??C)   TempSrc: Oral   SpO2: 100%   Weight: 220 lb (99.8 kg)      Temp Readings from Last 2 Encounters:   11/29/18 98.8 ??F (37.1 ??C) (Oral)   10/09/17 97.8 ??F (36.6 ??C) (Oral)     BP Readings from Last 2 Encounters:   11/29/18 139/89   10/09/17 (!) 163/101     Pulse Readings from Last 2 Encounters:   11/29/18 61   10/09/17 58        Constitutional:  Very well-appearing, no acute distress, non-toxic appearance  Eyes:  Sclera anicteric, conjunctiva normal.  HEENT:  Atraumatic, external ears normal, oropharynx moist.   Neck: normal range of motion, supple.  Musculoskeletal:  No edema, no deformities.  No midline lumbar spine tenderness does have some reproducible tenderness left paraspinal high lumbar region without deformity or asymmetry  Skin:  No rash or nodules noted.   Neurologic:  Awake and alert.  Moves all four extremities.  Light touch intact.    Psychiatric:  Normal mood.  Behavior appropriate.      Diagnostic Studies     Labs:      Labs Reviewed - No data to display    Radiology:  None      EKG interpretation: No EKG performed    Emergency Department Procedures     None    ED Course and MDM     Stephen Mcfarland is a 48 y.o. male who presented to the emergency department with low back strain without concerning neurologic deficits or radicular symptoms.  Quite well-appearing improved after medications in the ED discharge on NSAIDs and Lidoderm patches as needed.  Return for any progressive symptoms or new concerns.    Meds given in ED or prescribed for discharge:  Medications   ketorolac (TORADOL) injection 15 mg (has no administration in time range)   lidocaine (LIDODERM) 5 % 1 patch (has no administration in time range)       Clinical Impression:  1. Back strain, initial  encounter        Patient Referred to:    [facility] Emergency Department  [address]  [place] [address]  [phone]    If symptoms worsen      No future appointments.    Discharge Medications:  New Prescriptions    LIDOCAINE (SALONPAS, LIDOCAINE,) 4 % PTMD    Apply 1 patch topically 2 times a day as needed (back pain).    NAPROXEN (NAPROSYN) 500 MG TABLET    Take 1 tablet (500 mg total) by mouth 2 times a day as needed for Pain.             Critical Care Time (Attendings)   N/A      Scribe Attestation  By signing my name below, I, Stephen Mcfarland, attest that this documentation has been prepared under the direction and in the presence of Royce Sciara, MD  Scribe name: Stephen Mcfarland  Date: 11/29/2018      ----  Attending Physician Attestation  The scribe???s documentation has been prepared under my direction and personally reviewed by me in its entirety. I confirm that the note above accurately reflects all work, treatment, procedures, and medical decision making performed by me.          Amilio Zehnder, MD  11/29/18 1654

## 2018-11-29 NOTE — Unmapped (Signed)
Pt has complaints of lower back spasms since Saturday.

## 2019-07-14 ENCOUNTER — Other Ambulatory Visit: Payer: Self-pay | Admitting: Family Medicine

## 2019-07-28 ENCOUNTER — Other Ambulatory Visit: Payer: Self-pay | Admitting: Family Medicine
